# Patient Record
Sex: Male | Born: 1980 | Race: White | Hispanic: No | Marital: Married | Smoking: Never smoker
Health system: Southern US, Community
[De-identification: ages and names within clinical notes are randomized; demographics above are authoritative.]

## PROBLEM LIST (undated history)

## (undated) DIAGNOSIS — I10 Essential (primary) hypertension: Secondary | ICD-10-CM

## (undated) DIAGNOSIS — K644 Residual hemorrhoidal skin tags: Secondary | ICD-10-CM

## (undated) DIAGNOSIS — E785 Hyperlipidemia, unspecified: Secondary | ICD-10-CM

## (undated) DIAGNOSIS — T7840XA Allergy, unspecified, initial encounter: Secondary | ICD-10-CM

## (undated) HISTORY — DX: Residual hemorrhoidal skin tags: K64.4

## (undated) HISTORY — DX: Essential (primary) hypertension: I10

## (undated) HISTORY — DX: Hyperlipidemia, unspecified: E78.5

## (undated) HISTORY — DX: Allergy, unspecified, initial encounter: T78.40XA

---

## 2005-10-01 ENCOUNTER — Ambulatory Visit: Payer: Self-pay | Admitting: Family Medicine

## 2005-10-03 ENCOUNTER — Ambulatory Visit: Payer: Self-pay | Admitting: Family Medicine

## 2005-11-03 ENCOUNTER — Ambulatory Visit: Payer: Self-pay | Admitting: Family Medicine

## 2006-01-01 ENCOUNTER — Ambulatory Visit: Payer: Self-pay | Admitting: Family Medicine

## 2006-03-03 ENCOUNTER — Ambulatory Visit: Payer: Self-pay | Admitting: Family Medicine

## 2006-11-11 ENCOUNTER — Encounter: Payer: Self-pay | Admitting: Family Medicine

## 2006-11-11 DIAGNOSIS — I10 Essential (primary) hypertension: Secondary | ICD-10-CM

## 2006-11-11 DIAGNOSIS — R011 Cardiac murmur, unspecified: Secondary | ICD-10-CM | POA: Insufficient documentation

## 2006-11-11 DIAGNOSIS — E785 Hyperlipidemia, unspecified: Secondary | ICD-10-CM | POA: Insufficient documentation

## 2006-11-20 ENCOUNTER — Ambulatory Visit: Payer: Self-pay | Admitting: Family Medicine

## 2006-11-20 LAB — CONVERTED CEMR LAB
Albumin: 4.3 g/dL (ref 3.5–5.2)
CO2: 33 meq/L — ABNORMAL HIGH (ref 19–32)
Calcium: 10 mg/dL (ref 8.4–10.5)
Creatinine, Ser: 1.2 mg/dL (ref 0.4–1.5)
Glucose, Bld: 91 mg/dL (ref 70–99)
Phosphorus: 3 mg/dL (ref 2.3–4.6)
Potassium: 4.6 meq/L (ref 3.5–5.1)
Sodium: 141 meq/L (ref 135–145)
VLDL: 22 mg/dL (ref 0–40)

## 2009-02-16 ENCOUNTER — Ambulatory Visit: Payer: Self-pay | Admitting: Family Medicine

## 2009-02-19 LAB — CONVERTED CEMR LAB
ALT: 26 units/L (ref 0–53)
Alkaline Phosphatase: 86 units/L (ref 39–117)
BUN: 11 mg/dL (ref 6–23)
Basophils Relative: 0.1 % (ref 0.0–3.0)
Bilirubin, Direct: 0.1 mg/dL (ref 0.0–0.3)
Cholesterol: 270 mg/dL — ABNORMAL HIGH (ref 0–200)
Creatinine, Ser: 1.2 mg/dL (ref 0.4–1.5)
Eosinophils Absolute: 0.9 10*3/uL — ABNORMAL HIGH (ref 0.0–0.7)
Eosinophils Relative: 12.6 % — ABNORMAL HIGH (ref 0.0–5.0)
Glucose, Bld: 88 mg/dL (ref 70–99)
HDL: 55.4 mg/dL (ref >39.00)
Lymphocytes Relative: 25.2 % (ref 12.0–46.0)
MCV: 86.8 fL (ref 78.0–100.0)
Monocytes Absolute: 0.6 10*3/uL (ref 0.1–1.0)
Neutrophils Relative %: 53.8 % (ref 43.0–77.0)
Platelets: 268 10*3/uL (ref 150.0–400.0)
RBC: 5.97 M/uL — ABNORMAL HIGH (ref 4.22–5.81)
Sodium: 139 meq/L (ref 135–145)
Total Bilirubin: 1 mg/dL (ref 0.3–1.2)
Total CHOL/HDL Ratio: 5
Total Protein: 7.4 g/dL (ref 6.0–8.3)
Triglycerides: 157 mg/dL — ABNORMAL HIGH (ref 0.0–149.0)
VLDL: 31.4 mg/dL (ref 0.0–40.0)
WBC: 6.8 10*3/uL (ref 4.5–10.5)

## 2009-04-19 ENCOUNTER — Ambulatory Visit: Payer: Self-pay | Admitting: Family Medicine

## 2009-04-23 ENCOUNTER — Encounter: Payer: Self-pay | Admitting: Family Medicine

## 2009-04-23 LAB — CONVERTED CEMR LAB
ALT: 27 units/L (ref 0–53)
Cholesterol: 256 mg/dL — ABNORMAL HIGH (ref 0–200)
Total CHOL/HDL Ratio: 5
Triglycerides: 240 mg/dL — ABNORMAL HIGH (ref 0.0–149.0)
VLDL: 48 mg/dL — ABNORMAL HIGH (ref 0.0–40.0)

## 2009-06-07 ENCOUNTER — Ambulatory Visit: Payer: Self-pay | Admitting: Family Medicine

## 2009-06-07 LAB — CONVERTED CEMR LAB
ALT: 27 units/L (ref 0–53)
AST: 23 units/L (ref 0–37)
Cholesterol: 190 mg/dL (ref 0–200)
LDL Cholesterol: 103 mg/dL — ABNORMAL HIGH (ref 0–99)
Total CHOL/HDL Ratio: 4
VLDL: 34.4 mg/dL (ref 0.0–40.0)

## 2009-09-11 ENCOUNTER — Ambulatory Visit: Payer: Self-pay | Admitting: Family Medicine

## 2009-09-11 DIAGNOSIS — R109 Unspecified abdominal pain: Secondary | ICD-10-CM

## 2010-04-02 ENCOUNTER — Ambulatory Visit: Payer: Self-pay | Admitting: Family Medicine

## 2010-04-02 LAB — CONVERTED CEMR LAB
ALT: 19 units/L (ref 0–53)
AST: 23 units/L (ref 0–37)
BUN: 9 mg/dL (ref 6–23)
Basophils Absolute: 0.1 10*3/uL (ref 0.0–0.1)
CO2: 29 meq/L (ref 19–32)
Chloride: 100 meq/L (ref 96–112)
Cholesterol: 187 mg/dL (ref 0–200)
Eosinophils Relative: 8 % — ABNORMAL HIGH (ref 0.0–5.0)
GFR calc non Af Amer: 78.89 mL/min (ref >60)
HCT: 45.8 % (ref 39.0–52.0)
Hemoglobin: 15.7 g/dL (ref 13.0–17.0)
Lymphocytes Relative: 22.2 % (ref 12.0–46.0)
Monocytes Relative: 14.4 % — ABNORMAL HIGH (ref 3.0–12.0)
Neutro Abs: 4.3 10*3/uL (ref 1.4–7.7)
Phosphorus: 3.5 mg/dL (ref 2.3–4.6)
Potassium: 4.5 meq/L (ref 3.5–5.1)
RBC: 5.26 M/uL (ref 4.22–5.81)
RDW: 13 % (ref 11.5–14.6)
Sodium: 138 meq/L (ref 135–145)
WBC: 7.9 10*3/uL (ref 4.5–10.5)

## 2010-04-05 ENCOUNTER — Ambulatory Visit: Payer: Self-pay | Admitting: Family Medicine

## 2010-04-05 DIAGNOSIS — J309 Allergic rhinitis, unspecified: Secondary | ICD-10-CM | POA: Insufficient documentation

## 2010-08-27 NOTE — Assessment & Plan Note (Signed)
Summary: FOLLOW UP   Vital Signs:  Patient profile:   30 year old male Height:      67 inches Weight:      163.75 pounds BMI:     25.74 Temp:     97.9 degrees F oral Pulse rate:   76 / minute Pulse rhythm:   regular BP sitting:   138 / 88  (left arm) Cuff size:   regular  Vitals Entered By: Lewanda Rife LPN (September 11, 2009 8:00 AM)  Serial Vital Signs/Assessments:  Time      Position  BP       Pulse  Resp  Temp     By                     130/78                         Judith Part MD   History of Present Illness: here for f/u of HTN and hyperlipidemia   feels ok   ? if he has a hernia or pulled muscle -- on R side in groin area  started 1 week ago  ? if he strained it , does a lot of heavy lifting  not sharp pain- just discomfort- hurts to ride or sit  no bulge  ? if any worse with straining     bp toda stable 138/88 on first check  wt is down 4 lb  cholesterol is much imp on zocor with trig 172/ HDL 53 and LDL 103 is still really trying to watch his diet - difficult when traveling no side effects   Allergies (verified): No Known Drug Allergies  Past History:  Past Medical History: Last updated: 11/11/2006 Hyperlipidemia Hypertension  Past Surgical History: Last updated: 11/11/2006 2D Echo- neg. (09/2005)  Family History: Last updated: 02/16/2009 father HTN GF prostate cancer - died at 83  Social History: Last updated: 04/19/2009 non smoker  alcohol - 1-2 drinks per day max ( a little more in summer)   Review of Systems General:  Denies chills, fatigue, fever, loss of appetite, and malaise. Eyes:  Denies blurring. CV:  Denies chest pain or discomfort, palpitations, shortness of breath with exertion, and swelling of feet. Resp:  Denies cough and shortness of breath. GI:  Denies abdominal pain, bloody stools, change in bowel habits, gas, indigestion, and nausea. GU:  Denies discharge, dysuria, genital sores, hematuria, urinary frequency,  and urinary hesitancy. MS:  Denies joint redness, joint swelling, and muscle weakness. Derm:  Denies itching, lesion(s), poor wound healing, and rash. Neuro:  Denies headaches, numbness, and tingling. Endo:  Denies excessive thirst and excessive urination. Heme:  Denies abnormal bruising and bleeding.  Physical Exam  General:  Well-developed,well-nourished,in no acute distress; alert,appropriate and cooperative throughout examination Head:  normocephalic, atraumatic, and no abnormalities observed.   Eyes:  vision grossly intact, pupils equal, pupils round, and pupils reactive to light.  no conjunctival pallor, injection or icterus  Neck:  supple with full rom and no masses or thyromegally, no JVD or carotid bruit  Lungs:  Normal respiratory effort, chest expands symmetrically. Lungs are clear to auscultation, no crackles or wheezes. Heart:  RRR, quiet systolic murmur Abdomen:  Bowel sounds positive,abdomen soft and non-tender without masses, organomegaly or hernias noted. no renal bruits  no suprapubic tenderness or fullness felt   Genitalia:  very slt tenderness low R inguinal area  no M or LN or skin changes  no testicular masses or tenderness no hernias noted   Msk:  no LS tenderness no cva tenderness  nl rom legs and hips without pain  Pulses:  R and L carotid,radial,femoral,dorsalis pedis and posterior tibial pulses are full and equal bilaterally Extremities:  No clubbing, cyanosis, edema, or deformity noted with normal full range of motion of all joints.   Neurologic:  sensation intact to light touch, gait normal, and DTRs symmetrical and normal.   Skin:  Intact without suspicious lesions or rashes Cervical Nodes:  No lymphadenopathy noted Inguinal Nodes:  No significant adenopathy Psych:  normal affect, talkative and pleasant    Impression & Recommendations:  Problem # 1:  HYPERTENSION (ICD-401.9) Assessment Improved  is well controlled - better on second  check continue working on lifestyle  no change in med lab and f/u in fall planned  His updated medication list for this problem includes:    Prinivil 5 Mg Tabs (Lisinopril) .Marland Kitchen... 1 by mouth once daily  BP today: 138/88- re check 130/78 Prior BP: 120/86 (04/19/2009)  Labs Reviewed: K+: 4.2 (02/16/2009) Creat: : 1.2 (02/16/2009)   Chol: 190 (06/07/2009)   HDL: 53.00 (06/07/2009)   LDL: 103 (06/07/2009)   TG: 172.0 (06/07/2009)  Problem # 2:  HYPERLIPIDEMIA (ICD-272.4) Assessment: Improved  much imp with statin and diet rev sat fat diet  lab and f/u planned for fall  px sent  His updated medication list for this problem includes:    Zocor 20 Mg Tabs (Simvastatin) .Marland Kitchen... 1 by mouth once daily in evening with a low fat snack  Labs Reviewed: SGOT: 23 (06/07/2009)   SGPT: 27 (06/07/2009)   HDL:53.00 (06/07/2009), 47.50 (04/19/2009)  LDL:103 (06/07/2009), DEL (11/20/2006)  Chol:190 (06/07/2009), 256 (04/19/2009)  Trig:172.0 (06/07/2009), 240.0 (04/19/2009)  Orders: Prescription Created Electronically [phone])  Problem # 3:  INGUINAL PAIN, RIGHT (ICD-789.09) Assessment: New recurrent - after a strain  nl exam and no hernias detected  suspect groin strain  recommend heat / stretching gently and avoid heavy lifting if not imp in 2 wk- will call for urology appt  also adv to call if worse pain or any bulging  Complete Medication List: 1)  Prinivil 5 Mg Tabs (Lisinopril) .Marland Kitchen.. 1 by mouth once daily 2)  Multivitamins Tabs (Multiple vitamin) .... Take 1 tablet by mouth once a day 3)  Zocor 20 Mg Tabs (Simvastatin) .Marland Kitchen.. 1 by mouth once daily in evening with a low fat snack  Patient Instructions: 1)  use warm compress on area of discomfort  2)  lift using your legs  3)  if not improved in 2 weeks - please call me so I can refer you to a urologist  4)  no change in medicines  5)  schedule fasting labs in september lipid/ast/alt/renal /cbc with diff / tsh 401.1  Prescriptions: ZOCOR 20  MG TABS (SIMVASTATIN) 1 by mouth once daily in evening with a low fat snack  #30 x 11   Entered and Authorized by:   Judith Part MD   Signed by:   Judith Part MD on 09/11/2009   Method used:   Electronically to        [facility] [address] (retail)       [address], [address]       [place], [place]  [address]       Ph: [phone]       Fax: [phone]   RxID:  [id number]   Current Allergies (reviewed today): No known allergies

## 2010-08-27 NOTE — Assessment & Plan Note (Signed)
Summary: SEPT FOLLOW UP/[facility]   Vital Signs:  Patient profile:   30 year old male Height:      67 inches Weight:      151.31 pounds BMI:     23.78 Temp:     98.1 degrees F oral Pulse rate:   80 / minute Pulse rhythm:   regular BP sitting:   144 / 92  (left arm) Cuff size:   regular  Vitals Entered By: Lewanda Rife LPN (April 05, 2010 8:10 AM) CC: Sept f/u   History of Present Illness: here for f/u of HTN and lipids   wt is down 12 lb has been working on it  also a long hot summer -- eating less  is very active   is feeling ok overall   thinks he has allergies -- dripping and draining -- post nasal drip  allegra did not work    bp 144/92 first check  took his bp med   chol imp on zocor and diet with trig 149 and HDL 64 and LDL 93 (down from 103)  has been stressed   some ext hemorroids - occ they hurt     Allergies (verified): No Known Drug Allergies  Past History:  Past Surgical History: Last updated: 11/11/2006 2D Echo- neg. (09/2005)  Family History: Last updated: 02/16/2009 father HTN GF prostate cancer - died at 59  Social History: Last updated: 04/19/2009 non smoker  alcohol - 1-2 drinks per day max ( a little more in summer)   Past Medical History: Hyperlipidemia Hypertension allergic rhinitis ext hemorroids   Review of Systems General:  Denies chills, fatigue, fever, loss of appetite, and malaise. Eyes:  Denies blurring and eye irritation. ENT:  Complains of nasal congestion and postnasal drainage; denies sinus pressure and sore throat. CV:  Denies chest pain or discomfort, palpitations, and shortness of breath with exertion. Resp:  Denies cough, pleuritic, shortness of breath, sputum productive, and wheezing. GI:  Denies abdominal pain and change in bowel habits. GU:  Denies urinary frequency. MS:  Denies muscle aches and cramps. Derm:  Denies itching, lesion(s), poor wound healing, and rash. Neuro:  Denies headaches, numbness,  and tingling. Psych:  Denies anxiety and depression. Endo:  Denies cold intolerance, excessive thirst, excessive urination, and heat intolerance. Heme:  Denies abnormal bruising and bleeding. Allergy:  Complains of seasonal allergies and sneezing.  Physical Exam  General:  Well-developed,well-nourished,in no acute distress; alert,appropriate and cooperative throughout examination Head:  normocephalic, atraumatic, and no abnormalities observed.  no sinus tenderness Eyes:  vision grossly intact, pupils equal, pupils round, pupils reactive to light, and no injection.   Ears:  R ear normal and L ear normal.   Nose:  nares are injected and congested bilaterally  Mouth:  pharynx pink and moist.   Neck:  supple with full rom and no masses or thyromegally, no JVD or carotid bruit  Lungs:  Normal respiratory effort, chest expands symmetrically. Lungs are clear to auscultation, no crackles or wheezes. Heart:  RRR, quiet systolic murmur Abdomen:  Bowel sounds positive,abdomen soft and non-tender without masses, organomegaly or hernias noted. no renal bruits  Msk:  No deformity or scoliosis noted of thoracic or lumbar spine.   Pulses:  R and L carotid,radial,femoral,dorsalis pedis and posterior tibial pulses are full and equal bilaterally Extremities:  No clubbing, cyanosis, edema, or deformity noted with normal full range of motion of all joints.   Neurologic:  sensation intact to light touch, gait normal, and DTRs  symmetrical and normal.   Skin:  Intact without suspicious lesions or rashes Cervical Nodes:  No lymphadenopathy noted Psych:  normal affect, talkative and pleasant    Impression & Recommendations:  Problem # 1:  HYPERTENSION (ICD-401.9) Assessment Deteriorated  this is slt worse - pt reports systolics over 140 will inc prinivil to 10  update if no imp f/u 6 mo as planned rev labs today His updated medication list for this problem includes:    Prinivil 10 Mg Tabs (Lisinopril)  .Marland Kitchen... 1 by mouth once daily  BP today: 144/92 Prior BP: 138/88 (09/11/2009)  Labs Reviewed: K+: 4.5 (04/02/2010) Creat: : 1.2 (04/02/2010)   Chol: 187 (04/02/2010)   HDL: 64.60 (04/02/2010)   LDL: 93 (04/02/2010)   TG: 149.0 (04/02/2010)  Problem # 2:  HYPERLIPIDEMIA (ICD-272.4) Assessment: Improved  this is imp with better diet rev low sat fat diet  continue zocor  f/u 6 mo  His updated medication list for this problem includes:    Zocor 20 Mg Tabs (Simvastatin) .Marland Kitchen... 1 by mouth once daily in evening with a low fat snack  Labs Reviewed: SGOT: 23 (04/02/2010)   SGPT: 19 (04/02/2010)   HDL:64.60 (04/02/2010), 53.00 (06/07/2009)  LDL:93 (04/02/2010), 103 (16/04/9603)  Chol:187 (04/02/2010), 190 (06/07/2009)  Trig:149.0 (04/02/2010), 172.0 (06/07/2009)  Problem # 3:  ALLERGIC RHINITIS (ICD-477.9) Assessment: New mild with runny nose and no imp with allegra recommended trial of zyrtec otc and update if headache or fever will call His updated medication list for this problem includes:    Allegra Allergy 60 Mg Tabs (Fexofenadine hcl) ..... Otc as directed.  Complete Medication List: 1)  Prinivil 10 Mg Tabs (Lisinopril) .Marland Kitchen.. 1 by mouth once daily 2)  Multivitamins Tabs (Multiple vitamin) .... Take 1 tablet by mouth once a day 3)  Zocor 20 Mg Tabs (Simvastatin) .Marland Kitchen.. 1 by mouth once daily in evening with a low fat snack 4)  Allegra Allergy 60 Mg Tabs (Fexofenadine hcl) .... Otc as directed.  Patient Instructions: 1)  try zyrtec 10 mg at night for nasal symptoms 2)  increase your prinivil to 10 mg daily  3)  no change in other medicines 4)  keep watching diet for salt and fat  5)  follow up in 6 months  Prescriptions: PRINIVIL 10 MG TABS (LISINOPRIL) 1 by mouth once daily  #30 x 11   Entered and Authorized by:   Judith Part MD   Signed by:   Judith Part MD on 04/05/2010   Method used:   Print then Give to Patient   RxID:   [id number]   Current Allergies  (reviewed today): No known allergies

## 2010-10-01 ENCOUNTER — Ambulatory Visit: Payer: Self-pay | Admitting: Family Medicine

## 2010-11-10 ENCOUNTER — Other Ambulatory Visit: Payer: Self-pay | Admitting: Family Medicine

## 2010-11-30 ENCOUNTER — Encounter: Payer: Self-pay | Admitting: Family Medicine

## 2010-12-02 ENCOUNTER — Other Ambulatory Visit (INDEPENDENT_AMBULATORY_CARE_PROVIDER_SITE_OTHER): Payer: BC Managed Care – PPO | Admitting: Family Medicine

## 2010-12-02 DIAGNOSIS — E7800 Pure hypercholesterolemia, unspecified: Secondary | ICD-10-CM

## 2010-12-02 DIAGNOSIS — I10 Essential (primary) hypertension: Secondary | ICD-10-CM

## 2010-12-02 LAB — COMPREHENSIVE METABOLIC PANEL
ALT: 16 U/L (ref 0–53)
AST: 20 U/L (ref 0–37)
Alkaline Phosphatase: 66 U/L (ref 39–117)
CO2: 28 mEq/L (ref 19–32)
Creatinine, Ser: 1.2 mg/dL (ref 0.4–1.5)
GFR: 72.01 mL/min (ref >60.00)
Sodium: 138 mEq/L (ref 135–145)
Total Bilirubin: 0.7 mg/dL (ref 0.3–1.2)
Total Protein: 7.4 g/dL (ref 6.0–8.3)

## 2010-12-02 LAB — CBC WITH DIFFERENTIAL/PLATELET
Basophils Absolute: 0 10*3/uL (ref 0.0–0.1)
Basophils Relative: 0.4 % (ref 0.0–3.0)
Eosinophils Absolute: 0.4 10*3/uL (ref 0.0–0.7)
HCT: 43.6 % (ref 39.0–52.0)
Hemoglobin: 14.8 g/dL (ref 13.0–17.0)
Lymphocytes Relative: 30.3 % (ref 12.0–46.0)
Lymphs Abs: 2.5 10*3/uL (ref 0.7–4.0)
MCHC: 33.9 g/dL (ref 30.0–36.0)
MCV: 86.7 fl (ref 78.0–100.0)
Neutro Abs: 4.5 10*3/uL (ref 1.4–7.7)
RBC: 5.03 Mil/uL (ref 4.22–5.81)
RDW: 13.6 % (ref 11.5–14.6)

## 2010-12-02 LAB — LIPID PANEL
HDL: 56.8 mg/dL (ref >39.00)
LDL Cholesterol: 107 mg/dL — ABNORMAL HIGH (ref 0–99)
Total CHOL/HDL Ratio: 3
Triglycerides: 93 mg/dL (ref 0.0–149.0)
VLDL: 18.6 mg/dL (ref 0.0–40.0)

## 2010-12-09 ENCOUNTER — Encounter: Payer: Self-pay | Admitting: Family Medicine

## 2010-12-09 ENCOUNTER — Ambulatory Visit (INDEPENDENT_AMBULATORY_CARE_PROVIDER_SITE_OTHER): Payer: BC Managed Care – PPO | Admitting: Family Medicine

## 2010-12-09 DIAGNOSIS — E785 Hyperlipidemia, unspecified: Secondary | ICD-10-CM

## 2010-12-09 DIAGNOSIS — I10 Essential (primary) hypertension: Secondary | ICD-10-CM

## 2010-12-09 DIAGNOSIS — Z23 Encounter for immunization: Secondary | ICD-10-CM

## 2010-12-09 DIAGNOSIS — Z0000 Encounter for general adult medical examination without abnormal findings: Secondary | ICD-10-CM

## 2010-12-09 MED ORDER — LISINOPRIL 10 MG PO TABS: 10.0000 mg | ORAL_TABLET | Freq: Every day | ORAL | Status: DC

## 2010-12-09 MED ORDER — SIMVASTATIN 20 MG PO TABS: 20.0000 mg | ORAL_TABLET | Freq: Every day | ORAL | Status: DC

## 2010-12-09 NOTE — Assessment & Plan Note (Signed)
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

## 2010-12-09 NOTE — Assessment & Plan Note (Signed)
Much better on 2nd check today See inst re: meter Disc lower sodium diet Keep up exercise Ace refilled

## 2010-12-09 NOTE — Assessment & Plan Note (Signed)
Fair control- up a bit after missing some zocor doses Disc low sat fat diet Rev labs in detail Refilled statin- no prob with it

## 2010-12-09 NOTE — Progress Notes (Signed)
Subjective:    Patient ID: Robert Dickson, male    DOB: [birth year], 30 y.o.   MRN: [id number]  HPI Here for wellness exam   Feeling good - no new medical issues   Lipids are stable with LDL of 107 this check He did go a while off med -- was out of it  Now back on it 2 weeks  Lab Results  Component Value Date   CHOL 182 12/02/2010   CHOL 187 04/02/2010   CHOL 190 06/07/2009   Lab Results  Component Value Date   HDL 56.80 12/02/2010   HDL 64.60 04/02/2010   HDL 53.00 06/07/2009   Lab Results  Component Value Date   LDLCALC 107* 12/02/2010   LDLCALC 93 04/02/2010   LDLCALC 103* 06/07/2009   Lab Results  Component Value Date   TRIG 93.0 12/02/2010   TRIG 149.0 04/02/2010   TRIG 172.0* 06/07/2009   Lab Results  Component Value Date   CHOLHDL 3 12/02/2010   CHOLHDL 3 04/02/2010   CHOLHDL 4 06/07/2009   Lab Results  Component Value Date   LDLDIRECT 180.3 04/19/2009   LDLDIRECT 195.9 02/16/2009   LDLDIRECT 154.9 11/20/2006     HTN -- bp is 142/92- this is stable  Borderline  No ha or cp or swelling  On ace- no trouble with med at all  Checks at home - does not think it is accurate --140s/ 70-90   Wt is up 7 lb  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 Hypertension Father     gfather died of prostate cancer (paternal)   No urinary symptoms- /nocturia or stream changes  Past Medical History  Diagnosis Date  . Hyperlipidemia   . Hypertension   . Allergy     allergic rhinitis  . External hemorrhoid     History   Social History  . Marital Status: Single    Spouse Name: N/A    Number of Children: N/A  . Years of Education: N/A   Occupational History  . Not on file.   Social History Main Topics  . Smoking status: Never Smoker   . Smokeless tobacco: Not on file  . Alcohol Use: Yes     1-2  drinks/day(a littlem ore in summer)  . Drug Use:   . Sexually Active:    Other Topics Concern  . Not on file   Social History Narrative  . No narrative on file   No past surgical history on file. No Known Allergies   Review of Systems Review of Systems  Constitutional: Negative for fever, appetite change, fatigue and unexpected weight change.  Eyes: Negative for pain and visual disturbance.  Respiratory: Negative for cough and shortness of breath.   Cardiovascular: Negative for cp or sob or edema .   Gastrointestinal: Negative for nausea, diarrhea and constipation.  Genitourinary: Negative for urgency and frequency.  Skin: Negative for pallor.  Neurological: Negative for weakness, light-headedness, numbness and headaches.  Hematological: Negative for adenopathy. Does not bruise/bleed easily.  Psychiatric/Behavioral: Negative for dysphoric mood. The patient is not nervous/anxious.           Objective:   Physical Exam  Constitutional: He appears well-developed and well-nourished. No distress.  HENT:  Head: Normocephalic and atraumatic.  Right Ear: External ear normal.  Left Ear: External ear normal.  Nose: Nose normal.  Mouth/Throat: Oropharynx is clear and moist.  Eyes: Conjunctivae and EOM are normal. Pupils are equal, round, and reactive to light.  Neck: Normal range of motion. Neck supple. No JVD present. Carotid bruit is not present. No thyromegaly present.  Cardiovascular: Normal rate, regular rhythm and normal heart sounds.        No M heard today  Pulmonary/Chest: Effort normal and breath sounds normal. No respiratory distress. He has no wheezes. He exhibits no tenderness.  Abdominal: Soft. Bowel sounds are normal. He exhibits no distension, no abdominal bruit and no mass. There is no tenderness.  Musculoskeletal: Normal range of motion. He exhibits no edema and no tenderness.  Lymphadenopathy:    He has no cervical adenopathy.  Neurological: He is alert. He has  normal reflexes. Coordination normal.  Skin: Skin is warm and dry. No rash noted. No erythema. No pallor.  Psychiatric: He has a normal mood and affect.          Assessment & Plan:

## 2010-12-09 NOTE — Patient Instructions (Signed)
No change in medicine bp was better 2nd check 128/80  If you want a new cuff for home I recommend OMRON cuff for arm (not wrist)  Try to choose better foods when eating out tetnus shot today

## 2012-01-16 ENCOUNTER — Other Ambulatory Visit: Payer: Self-pay | Admitting: Family Medicine

## 2012-01-16 NOTE — Telephone Encounter (Signed)
30-day supply given/SLS *PATIENT IS DUE FOR OFFICE VISIT*

## 2012-02-09 ENCOUNTER — Other Ambulatory Visit: Payer: Self-pay

## 2012-02-09 MED ORDER — LISINOPRIL 10 MG PO TABS: 10.0000 mg | ORAL_TABLET | Freq: Every day | ORAL | Status: DC

## 2012-02-09 MED ORDER — SIMVASTATIN 20 MG PO TABS: 20.0000 mg | ORAL_TABLET | Freq: Every day | ORAL | Status: DC

## 2012-02-09 NOTE — Telephone Encounter (Signed)
Will refill electronically  

## 2012-02-09 NOTE — Telephone Encounter (Signed)
Pt request refill Simvastatin and Lisinopril to [facility] [address]. Pt last seen 12/09/10; CPX scheduled 06/25/12.Please advise.

## 2012-06-20 ENCOUNTER — Telehealth: Payer: Self-pay | Admitting: Family Medicine

## 2012-06-20 DIAGNOSIS — E785 Hyperlipidemia, unspecified: Secondary | ICD-10-CM

## 2012-06-20 DIAGNOSIS — Z0000 Encounter for general adult medical examination without abnormal findings: Secondary | ICD-10-CM

## 2012-06-20 NOTE — Telephone Encounter (Signed)
Message copied by Judy Pimple on Sun Jun 20, 2012  8:13 PM ------      Message from: Baldomero Lamy      Created: Thu Jun 10, 2012 11:41 AM      Regarding: Cpx labs 06/21/12 Mon       Please order  future cpx labs for pt's upcomming lab appt.      Thanks      Rodney Booze

## 2012-06-21 ENCOUNTER — Other Ambulatory Visit (INDEPENDENT_AMBULATORY_CARE_PROVIDER_SITE_OTHER): Payer: BC Managed Care – PPO

## 2012-06-21 DIAGNOSIS — Z0000 Encounter for general adult medical examination without abnormal findings: Secondary | ICD-10-CM

## 2012-06-21 DIAGNOSIS — E785 Hyperlipidemia, unspecified: Secondary | ICD-10-CM

## 2012-06-21 LAB — CBC WITH DIFFERENTIAL/PLATELET
Basophils Absolute: 0 10*3/uL (ref 0.0–0.1)
Eosinophils Absolute: 0.6 10*3/uL (ref 0.0–0.7)
Lymphocytes Relative: 38.9 % (ref 12.0–46.0)
MCHC: 33.3 g/dL (ref 30.0–36.0)
Neutro Abs: 3.7 10*3/uL (ref 1.4–7.7)
Neutrophils Relative %: 45.9 % (ref 43.0–77.0)
RDW: 13.1 % (ref 11.5–14.6)

## 2012-06-21 LAB — COMPREHENSIVE METABOLIC PANEL
ALT: 18 U/L (ref 0–53)
AST: 22 U/L (ref 0–37)
Alkaline Phosphatase: 64 U/L (ref 39–117)
Calcium: 9.8 mg/dL (ref 8.4–10.5)
Chloride: 103 mEq/L (ref 96–112)
Creatinine, Ser: 1.1 mg/dL (ref 0.4–1.5)
Potassium: 4.9 mEq/L (ref 3.5–5.1)

## 2012-06-21 LAB — LIPID PANEL
HDL: 65 mg/dL (ref >39.00)
Total CHOL/HDL Ratio: 3

## 2012-06-21 LAB — LDL CHOLESTEROL, DIRECT: Direct LDL: 139.5 mg/dL

## 2012-06-21 LAB — TSH: TSH: 1.98 u[IU]/mL (ref 0.35–5.50)

## 2012-06-25 ENCOUNTER — Encounter: Payer: BC Managed Care – PPO | Admitting: Family Medicine

## 2012-06-28 ENCOUNTER — Encounter: Payer: Self-pay | Admitting: Family Medicine

## 2012-06-28 ENCOUNTER — Ambulatory Visit (INDEPENDENT_AMBULATORY_CARE_PROVIDER_SITE_OTHER): Payer: BC Managed Care – PPO | Admitting: Family Medicine

## 2012-06-28 VITALS — BP 135/85 | HR 78 | Temp 98.6°F | Ht 67.25 in | Wt 161.2 lb

## 2012-06-28 DIAGNOSIS — Z0000 Encounter for general adult medical examination without abnormal findings: Secondary | ICD-10-CM

## 2012-06-28 DIAGNOSIS — I10 Essential (primary) hypertension: Secondary | ICD-10-CM

## 2012-06-28 DIAGNOSIS — E785 Hyperlipidemia, unspecified: Secondary | ICD-10-CM

## 2012-06-28 DIAGNOSIS — Z23 Encounter for immunization: Secondary | ICD-10-CM

## 2012-06-28 MED ORDER — SIMVASTATIN 20 MG PO TABS: 20.0000 mg | ORAL_TABLET | Freq: Every day | ORAL | Status: DC

## 2012-06-28 MED ORDER — LISINOPRIL 10 MG PO TABS: 10.0000 mg | ORAL_TABLET | Freq: Every day | ORAL | Status: DC

## 2012-06-28 NOTE — Assessment & Plan Note (Signed)
Reviewed health habits including diet and exercise and skin cancer prevention Also reviewed health mt list, fam hx and immunizations  Flu vaccine today Urged to quit smoking entirely  Wellness labs reviewed

## 2012-06-28 NOTE — Patient Instructions (Addendum)
Blood pressure was better on 2nd check  Keep an eye on it at home- alert me if over 140/90  Check it when relaxed  Try to minimize or quit smoking Flu shot today  No change in medicine

## 2012-06-28 NOTE — Assessment & Plan Note (Signed)
Better on 2nd check  May be inc slt with day quil otc Will watch this at home Labs rev Health habits rev Urged to quit smoking entirely

## 2012-06-28 NOTE — Assessment & Plan Note (Signed)
Lipids up a bit - but draw was done right after TG dinner On zocor-no problems Disc goals for lipids and reasons to control them Rev labs with pt Rev low sat fat diet in detail

## 2012-06-28 NOTE — Progress Notes (Signed)
Subjective:    Patient ID: Robert Dickson, male    DOB: [birth year], 31 y.o.   MRN: [id number]  HPI Here for health maintenance exam and to review chronic medical problems   Is doing ok overall   Nothing new going on health wise  Has passed a cold around the house   Took day quil-- and that could run bp up   Wants to get a flu shot   Wt is up 3 lb with bmi of 25  bp is up today on first check  No cp or palpitations or headaches or edema  No side effects to medicines  Thinks he took his medicine this am  BP Readings from Last 3 Encounters:  06/28/12 158/104  12/09/10 128/80  04/05/10 144/92     Smoking status Smokes once in a blue moon - socially   Hyperlipidemia Lab Results  Component Value Date   CHOL 211* 06/21/2012   CHOL 182 12/02/2010   CHOL 187 04/02/2010   Lab Results  Component Value Date   HDL 65.00 06/21/2012   HDL 56.80 12/02/2010   HDL 64.60 04/02/2010   Lab Results  Component Value Date   LDLCALC 107* 12/02/2010   LDLCALC 93 04/02/2010   LDLCALC 103* 06/07/2009   Lab Results  Component Value Date   TRIG 175.0* 06/21/2012   TRIG 93.0 12/02/2010   TRIG 149.0 04/02/2010   Lab Results  Component Value Date   CHOLHDL 3 06/21/2012   CHOLHDL 3 12/02/2010   CHOLHDL 3 04/02/2010   Lab Results  Component Value Date   LDLDIRECT 139.5 06/21/2012   LDLDIRECT 180.3 04/19/2009   LDLDIRECT 195.9 02/16/2009  on zocor-no problems with that     Mood - has a lot of stress  Handling it well - mood is good    Patient Active Problem List  Diagnosis  . HYPERLIPIDEMIA  . HYPERTENSION  . ALLERGIC RHINITIS  . MURMUR  . Routine general medical examination at a health care facility   Past Medical History  Diagnosis Date  . Hyperlipidemia   . Hypertension   . Allergy     allergic rhinitis  . External hemorrhoid    No past surgical history on file. History  Substance Use Topics  . Smoking status: Current Some Day Smoker    Types: Cigarettes  . Smokeless tobacco: Not  on file     Comment: social smoker  . Alcohol Use: Yes     Comment: 1-2 drinks/day(a littlem ore in summer)   Family History  Problem Relation Age of Onset  . Hypertension Father    No Known Allergies Current Outpatient Prescriptions on File Prior to Visit  Medication Sig Dispense Refill  . fexofenadine (ALLEGRA) 60 MG tablet OTC as directed.       Marland Kitchen lisinopril (PRINIVIL,ZESTRIL) 10 MG tablet Take 1 tablet (10 mg total) by mouth daily.  30 tablet  5  . Multiple Vitamin (MULTIVITAMIN) capsule Take 1 capsule by mouth daily.        . simvastatin (ZOCOR) 20 MG tablet Take 1 tablet (20 mg total) by mouth at bedtime.  30 tablet  5  . [DISCONTINUED] simvastatin (ZOCOR) 20 MG tablet Take 1 tablet (20 mg total) by mouth at bedtime.  30 tablet  11     Review of Systems Review of Systems  Constitutional: Negative for fever, appetite change, fatigue and unexpected weight change.  Eyes: Negative for pain and visual disturbance.  ENT pos for runny nose and post  nasal drip from a cold  Respiratory: Negative for cough and shortness of breath.   Cardiovascular: Negative for cp or palpitations    Gastrointestinal: Negative for nausea, diarrhea and constipation.  Genitourinary: Negative for urgency and frequency.  Skin: Negative for pallor or rash   Neurological: Negative for weakness, light-headedness, numbness and headaches.  Hematological: Negative for adenopathy. Does not bruise/bleed easily.  Psychiatric/Behavioral: Negative for dysphoric mood. The patient is not nervous/anxious.         Objective:   Physical Exam  Constitutional: He appears well-developed and well-nourished. No distress.  HENT:  Head: Normocephalic and atraumatic.  Right Ear: External ear normal.  Left Ear: External ear normal.  Mouth/Throat: Oropharynx is clear and moist. No oropharyngeal exudate.       Nares are injected and congested  No sinus tenderness  Eyes: Conjunctivae normal and EOM are normal. Pupils are  equal, round, and reactive to light. Right eye exhibits no discharge. Left eye exhibits no discharge. No scleral icterus.  Neck: Normal range of motion. Neck supple. No JVD present. Carotid bruit is not present. No thyromegaly present.  Cardiovascular: Normal rate, regular rhythm and intact distal pulses.  Exam reveals no gallop.   Murmur heard. Pulmonary/Chest: Effort normal and breath sounds normal. No respiratory distress. He has no wheezes. He exhibits no tenderness.  Abdominal: Soft. Bowel sounds are normal. He exhibits no distension, no abdominal bruit and no mass. There is no tenderness.  Musculoskeletal: He exhibits no edema and no tenderness.  Lymphadenopathy:    He has no cervical adenopathy.  Neurological: He is alert. He has normal reflexes. No cranial nerve deficit. He exhibits normal muscle tone. Coordination normal.  Skin: Skin is warm and dry. No rash noted. No erythema. No pallor.       Solar lentigos noted   Psychiatric: He has a normal mood and affect.          Assessment & Plan:

## 2013-07-14 ENCOUNTER — Other Ambulatory Visit: Payer: Self-pay | Admitting: Family Medicine

## 2013-07-14 NOTE — Telephone Encounter (Signed)
Please schedule PE in late spring and refill until then, thanks

## 2013-07-14 NOTE — Telephone Encounter (Signed)
Electronic refill request, no recent/future appt., please advise  

## 2013-07-15 NOTE — Telephone Encounter (Signed)
LM on pt vm requesting a call back

## 2013-07-19 NOTE — Telephone Encounter (Signed)
Pt coming in tomorrow for med refill, will refill at appt, med declined and advise pharmacy we will refill at appt

## 2013-07-20 ENCOUNTER — Ambulatory Visit (INDEPENDENT_AMBULATORY_CARE_PROVIDER_SITE_OTHER): Payer: PRIVATE HEALTH INSURANCE | Admitting: Family Medicine

## 2013-07-20 ENCOUNTER — Encounter: Payer: Self-pay | Admitting: Family Medicine

## 2013-07-20 VITALS — BP 158/92 | HR 88 | Temp 98.2°F | Ht 67.25 in | Wt 157.5 lb

## 2013-07-20 DIAGNOSIS — B070 Plantar wart: Secondary | ICD-10-CM

## 2013-07-20 DIAGNOSIS — E785 Hyperlipidemia, unspecified: Secondary | ICD-10-CM

## 2013-07-20 DIAGNOSIS — I10 Essential (primary) hypertension: Secondary | ICD-10-CM

## 2013-07-20 DIAGNOSIS — Z23 Encounter for immunization: Secondary | ICD-10-CM

## 2013-07-20 DIAGNOSIS — J309 Allergic rhinitis, unspecified: Secondary | ICD-10-CM

## 2013-07-20 LAB — LIPID PANEL: HDL: 85.3 mg/dL (ref >39.00)

## 2013-07-20 LAB — CBC WITH DIFFERENTIAL/PLATELET
Basophils Absolute: 0 10*3/uL (ref 0.0–0.1)
Basophils Relative: 0.5 % (ref 0.0–3.0)
Eosinophils Absolute: 0.3 10*3/uL (ref 0.0–0.7)
HCT: 48.1 % (ref 39.0–52.0)
Hemoglobin: 16 g/dL (ref 13.0–17.0)
Lymphs Abs: 2.6 10*3/uL (ref 0.7–4.0)
MCHC: 33.2 g/dL (ref 30.0–36.0)
MCV: 85.6 fl (ref 78.0–100.0)
Monocytes Absolute: 0.6 10*3/uL (ref 0.1–1.0)
Neutro Abs: 3.8 10*3/uL (ref 1.4–7.7)
Platelets: 381 10*3/uL (ref 150.0–400.0)
RBC: 5.62 Mil/uL (ref 4.22–5.81)

## 2013-07-20 LAB — COMPREHENSIVE METABOLIC PANEL
ALT: 19 U/L (ref 0–53)
AST: 25 U/L (ref 0–37)
Creatinine, Ser: 1.1 mg/dL (ref 0.4–1.5)
Sodium: 137 mEq/L (ref 135–145)
Total Bilirubin: 0.4 mg/dL (ref 0.3–1.2)

## 2013-07-20 LAB — LDL CHOLESTEROL, DIRECT: Direct LDL: 102.5 mg/dL

## 2013-07-20 MED ORDER — FLUTICASONE PROPIONATE 50 MCG/ACT NA SUSP: 2.0000 | Freq: Every day | NASAL | Status: DC

## 2013-07-20 MED ORDER — SIMVASTATIN 20 MG PO TABS: 20.0000 mg | ORAL_TABLET | Freq: Every day | ORAL | Status: DC

## 2013-07-20 MED ORDER — LISINOPRIL 10 MG PO TABS: 10.0000 mg | ORAL_TABLET | Freq: Every day | ORAL | Status: DC

## 2013-07-20 NOTE — Progress Notes (Signed)
Pre-visit discussion using our clinic review tool. No additional management support is needed unless otherwise documented below in the visit note.  

## 2013-07-20 NOTE — Patient Instructions (Signed)
Take care of yourself  Re start medicine  Try compound W on wart and soak it / use pumice stone over time to wear it down (follow up if no immprovement) For allergies- start flonase daily and update me if worse or not improving in the next 2 weeks

## 2013-07-20 NOTE — Progress Notes (Signed)
Subjective:    Patient ID: Robert Dickson, male    DOB: 09-20-1980, 32 y.o.   MRN: [id number]  HPI Here for medicine refills and thinks he may have sinus infection / allergies  Out 1-2 weeks -- and bp is high   bp is up  today  No cp or palpitations or headaches or edema  No side effects to medicines  BP Readings from Last 3 Encounters:  07/20/13 158/92  06/28/12 135/85  12/09/10 128/80     No problematic blood pressure  He is taking care of himself fairly   Eats so/ so --- watches out for cholesterol    (family is on health kick) No exercise outside of work  Is fasting today  Cholesterol will be up a bit due to missing medicine   Just had flu shot now   Some chronic sinus congestion  Going on for months No fever No facial pain/ a little pressure in am   Smokes very rarely   Is thinking about a vasectomy    Patient Active Problem List   Diagnosis Date Noted  . Routine general medical examination at a health care facility 12/09/2010  . ALLERGIC RHINITIS 04/05/2010  . HYPERLIPIDEMIA 11/11/2006  . HYPERTENSION 11/11/2006  . MURMUR 11/11/2006   Past Medical History  Diagnosis Date  . Hyperlipidemia   . Hypertension   . Allergy     allergic rhinitis  . External hemorrhoid    No past surgical history on file. History  Substance Use Topics  . Smoking status: Current Some Day Smoker    Types: Cigarettes  . Smokeless tobacco: Not on file     Comment: social smoker  . Alcohol Use: Yes     Comment: 1-2 drinks/day(a littlem ore in summer)   Family History  Problem Relation Age of Onset  . Hypertension Father    No Known Allergies Current Outpatient Prescriptions on File Prior to Visit  Medication Sig Dispense Refill  . fexofenadine (ALLEGRA) 60 MG tablet OTC as directed.       Marland Kitchen lisinopril (PRINIVIL,ZESTRIL) 10 MG tablet Take 1 tablet (10 mg total) by mouth daily.  30 tablet  11  . Multiple Vitamin (MULTIVITAMIN) capsule Take 1 capsule by mouth daily.         . simvastatin (ZOCOR) 20 MG tablet Take 1 tablet (20 mg total) by mouth at bedtime.  30 tablet  11   No current facility-administered medications on file prior to visit.    Review of Systems Review of Systems  Constitutional: Negative for fever, appetite change, fatigue and unexpected weight change.  Eyes: Negative for pain and visual disturbance.  ENt pos for cong and rhinorrhea/ neg for ST or facial pain  Respiratory: Negative for cough and shortness of breath.   Cardiovascular: Negative for cp or palpitations    Gastrointestinal: Negative for nausea, diarrhea and constipation.  Genitourinary: Negative for urgency and frequency.  Skin: Negative for pallor or rash   Neurological: Negative for weakness, light-headedness, numbness and headaches.  Hematological: Negative for adenopathy. Does not bruise/bleed easily.  Psychiatric/Behavioral: Negative for dysphoric mood. The patient is not nervous/anxious.         Objective:   Physical Exam  Constitutional: He appears well-developed and well-nourished. No distress.  HENT:  Head: Normocephalic and atraumatic.  Right Ear: External ear normal.  Left Ear: External ear normal.  Mouth/Throat: Oropharynx is clear and moist. No oropharyngeal exudate.  Nares are injected and congested   Clear rhinorrhea No  facial pain   Eyes: Conjunctivae and EOM are normal. Pupils are equal, round, and reactive to light. Right eye exhibits no discharge. Left eye exhibits no discharge.  Neck: Normal range of motion. Neck supple. No JVD present. Carotid bruit is not present. No thyromegaly present.  Cardiovascular: Normal rate, regular rhythm and intact distal pulses.  Exam reveals no gallop.   Murmur heard. Pulmonary/Chest: Effort normal and breath sounds normal. No respiratory distress. He has no wheezes. He has no rales.  Abdominal: Soft. Bowel sounds are normal. He exhibits no distension, no abdominal bruit and no mass. There is no tenderness.    Musculoskeletal: He exhibits no edema.  Lymphadenopathy:    He has no cervical adenopathy.  Neurological: He is alert. He has normal reflexes. No cranial nerve deficit. He exhibits normal muscle tone. Coordination normal.  Skin: Skin is warm and dry. No rash noted. No erythema. No pallor.  Small plantar wart seen on L heel   Psychiatric: He has a normal mood and affect.          Assessment & Plan:

## 2013-07-21 NOTE — Assessment & Plan Note (Signed)
bp up today- after missing lisinopril  Start back on it Lab today bp has been in fair control No changes needed Disc lifstyle change with low sodium diet and exercise

## 2013-07-21 NOTE — Assessment & Plan Note (Signed)
Pt mentioned this on way out  Disc use of compound W and then soaks/debridement  F/u if no imp

## 2013-07-21 NOTE — Assessment & Plan Note (Signed)
Lipids today- will be up w/o med for 1-2 wk  Disc goals for lipids and reasons to control them Rev labs with pt from last draw  Rev low sat fat diet in detail

## 2013-07-21 NOTE — Assessment & Plan Note (Signed)
With chronic congestion  Add flonase Update if no imp or if facial pain or fever

## 2013-07-22 ENCOUNTER — Encounter: Payer: Self-pay | Admitting: *Deleted

## 2014-06-27 ENCOUNTER — Ambulatory Visit (INDEPENDENT_AMBULATORY_CARE_PROVIDER_SITE_OTHER): Payer: PRIVATE HEALTH INSURANCE | Admitting: Family Medicine

## 2014-06-27 ENCOUNTER — Encounter: Payer: Self-pay | Admitting: Family Medicine

## 2014-06-27 VITALS — BP 135/85 | HR 94 | Temp 98.1°F | Ht 67.25 in | Wt 158.0 lb

## 2014-06-27 DIAGNOSIS — F458 Other somatoform disorders: Secondary | ICD-10-CM

## 2014-06-27 DIAGNOSIS — Z0000 Encounter for general adult medical examination without abnormal findings: Secondary | ICD-10-CM | POA: Diagnosis not present

## 2014-06-27 DIAGNOSIS — E785 Hyperlipidemia, unspecified: Secondary | ICD-10-CM

## 2014-06-27 DIAGNOSIS — Z23 Encounter for immunization: Secondary | ICD-10-CM

## 2014-06-27 DIAGNOSIS — I10 Essential (primary) hypertension: Secondary | ICD-10-CM

## 2014-06-27 DIAGNOSIS — R09A2 Foreign body sensation, throat: Secondary | ICD-10-CM | POA: Insufficient documentation

## 2014-06-27 DIAGNOSIS — R0989 Other specified symptoms and signs involving the circulatory and respiratory systems: Secondary | ICD-10-CM | POA: Insufficient documentation

## 2014-06-27 LAB — CBC WITH DIFFERENTIAL/PLATELET
Basophils Absolute: 0 10*3/uL (ref 0.0–0.1)
Basophils Relative: 0.5 % (ref 0.0–3.0)
EOS ABS: 0.4 10*3/uL (ref 0.0–0.7)
EOS PCT: 5.4 % — AB (ref 0.0–5.0)
HCT: 47.1 % (ref 39.0–52.0)
HEMOGLOBIN: 15.6 g/dL (ref 13.0–17.0)
Lymphocytes Relative: 24.7 % (ref 12.0–46.0)
Lymphs Abs: 1.9 10*3/uL (ref 0.7–4.0)
MCHC: 33.1 g/dL (ref 30.0–36.0)
MCV: 86.7 fl (ref 78.0–100.0)
MONOS PCT: 8.6 % (ref 3.0–12.0)
Monocytes Absolute: 0.7 10*3/uL (ref 0.1–1.0)
NEUTROS ABS: 4.6 10*3/uL (ref 1.4–7.7)
NEUTROS PCT: 60.8 % (ref 43.0–77.0)
Platelets: 338 10*3/uL (ref 150.0–400.0)
RBC: 5.44 Mil/uL (ref 4.22–5.81)
RDW: 12.6 % (ref 11.5–15.5)
WBC: 7.6 10*3/uL (ref 4.0–10.5)

## 2014-06-27 LAB — TSH: TSH: 2.22 u[IU]/mL (ref 0.35–4.50)

## 2014-06-27 MED ORDER — SIMVASTATIN 20 MG PO TABS: 20.0000 mg | ORAL_TABLET | Freq: Every day | ORAL | Status: DC

## 2014-06-27 MED ORDER — LISINOPRIL 10 MG PO TABS: 10.0000 mg | ORAL_TABLET | Freq: Every day | ORAL | Status: DC

## 2014-06-27 NOTE — Progress Notes (Signed)
Pre visit review using our clinic review tool, if applicable. No additional management support is needed unless otherwise documented below in the visit note. 

## 2014-06-27 NOTE — Progress Notes (Signed)
Subjective:    Patient ID: Robert Dickson, male    DOB: [birth year], 33 y.o.   MRN: [id number]  HPI Here for annual exam/ prev care and chronic medical issue   Wants to get a flu shot today  Tdap 2012  Has been feeling ok overall   He always has mucous in his throat  Never smoked  Used to dip- quit that  occ runny nose  Has heartburn occasionally - less than once per week  No sore throat  He takes allegra - does not help a lot   He got shocked this year during work- and had to be hospitalized  He was not hurt badly   bp is up today  No cp or palpitations or headaches or edema  No side effects to medicines  He had med on the way here  Has had nl bp outside the office  BP Readings from Last 3 Encounters:  06/27/14 148/94  07/20/13 158/92  06/28/12 135/85   135/85    Hx of hyperlipidemia  zocor and diet  Lab Results  Component Value Date   CHOL 209* 07/20/2013   HDL 85.30 07/20/2013   LDLCALC 107* 12/02/2010   LDLDIRECT 102.5 07/20/2013   TRIG 193.0* 07/20/2013   CHOLHDL 2 07/20/2013   that was after being out of med  No missed doses now  Not eating as well - "drive through diet"- very hectic lifestyle   Will be able to get to the gym after the holidays  Was going 5 days per week  Also will be able to eat better   Wt is stable with bmi of 24   Patient Active Problem List   Diagnosis Date Noted  . Plantar wart, left foot 07/20/2013  . Routine general medical examination at a health care facility 12/09/2010  . ALLERGIC RHINITIS 04/05/2010  . HYPERLIPIDEMIA 11/11/2006  . HYPERTENSION 11/11/2006  . MURMUR 11/11/2006   Past Medical History  Diagnosis Date  . Hyperlipidemia   . Hypertension   . Allergy     allergic rhinitis  . External hemorrhoid    No past surgical history on file. History  Substance Use Topics  . Smoking status: Former Smoker    Types: Cigarettes  . Smokeless tobacco: Not on file     Comment: social smoker  . Alcohol Use: 0.0  oz/week    0 Not specified per week     Comment: 1-2 drinks/day(a littlem ore in summer)   Family History  Problem Relation Age of Onset  . Hypertension Father    No Known Allergies Current Outpatient Prescriptions on File Prior to Visit  Medication Sig Dispense Refill  . fexofenadine (ALLEGRA) 60 MG tablet OTC as directed.     Marland Kitchen. lisinopril (PRINIVIL,ZESTRIL) 10 MG tablet Take 1 tablet (10 mg total) by mouth daily. 30 tablet 11  . Multiple Vitamin (MULTIVITAMIN) capsule Take 1 capsule by mouth daily.      . simvastatin (ZOCOR) 20 MG tablet Take 1 tablet (20 mg total) by mouth at bedtime. 30 tablet 11   No current facility-administered medications on file prior to visit.    Review of Systems    Review of Systems  Constitutional: Negative for fever, appetite change, fatigue and unexpected weight change.  ENT pos for post nasal drip / throat clearing/ neg for ST Eyes: Negative for pain and visual disturbance.  Respiratory: Negative for wheeze and shortness of breath.   Cardiovascular: Negative for cp or palpitations  Gastrointestinal: Negative for nausea, diarrhea and constipation.  Genitourinary: Negative for urgency and frequency.  Skin: Negative for pallor or rash   Neurological: Negative for weakness, light-headedness, numbness and headaches.  Hematological: Negative for adenopathy. Does not bruise/bleed easily.  Psychiatric/Behavioral: Negative for dysphoric mood. The patient is not nervous/anxious.      Objective:   Physical Exam  Constitutional: He appears well-developed and well-nourished. No distress.  HENT:  Head: Normocephalic and atraumatic.  Right Ear: External ear normal.  Left Ear: External ear normal.  Nose: Nose normal.  Mouth/Throat: Oropharynx is clear and moist.  No rhinorrhea   Eyes: Conjunctivae and EOM are normal. Pupils are equal, round, and reactive to light. Right eye exhibits no discharge. Left eye exhibits no discharge. No scleral icterus.    Neck: Normal range of motion. Neck supple. No JVD present. Carotid bruit is not present. No thyromegaly present.  Cardiovascular: Normal rate, regular rhythm, normal heart sounds and intact distal pulses.  Exam reveals no gallop.   Pulmonary/Chest: Effort normal and breath sounds normal. No respiratory distress. He has no wheezes. He exhibits no tenderness.  Abdominal: Soft. Bowel sounds are normal. He exhibits no distension, no abdominal bruit and no mass. There is no tenderness.  Musculoskeletal: He exhibits no edema or tenderness.  Lymphadenopathy:    He has no cervical adenopathy.  Neurological: He is alert. He has normal reflexes. No cranial nerve deficit. He exhibits normal muscle tone. Coordination normal.  Skin: Skin is warm and dry. No rash noted. No erythema. No pallor.  Psychiatric: He has a normal mood and affect.          Assessment & Plan:   Problem List Items Addressed This Visit      Cardiovascular and Mediastinum   Essential hypertension    bp in fair control at this time  BP Readings from Last 1 Encounters:  06/27/14 135/85  (better on 2nd check) No changes needed-continue ace  Disc lifstyle change with low sodium diet and exercise   Labs today    Relevant Medications      lisinopril (PRINIVIL,ZESTRIL) tablet      simvastatin (ZOCOR) tablet     Other   Globus sensation    Disc poss of post nasal drip vs gerd  Will try zyrtec and also zantac for 2 wk and update  If not imp -would ref to ENT in light of hx of dipping tobacco     Hyperlipidemia    On zocor and diet Rev low sat fat diet=will work on this  Labs today    Relevant Medications      lisinopril (PRINIVIL,ZESTRIL) tablet      simvastatin (ZOCOR) tablet   Routine general medical examination at a health care facility - Primary    Reviewed health habits including diet and exercise and skin cancer prevention Reviewed appropriate screening tests for age  Also reviewed health mt list, fam hx and  immunization status , as well as social and family history    Flu vaccine today  Labs today Disc plan for better diet and exercise     Relevant Orders      CBC with Differential (Completed)      Comprehensive metabolic panel (Completed)      TSH (Completed)      Lipid panel (Completed)    Other Visit Diagnoses    Need for prophylactic vaccination and inoculation against influenza        Relevant Orders  Flu Vaccine QUAD 36+ mos PF IM (Fluarix Quad PF) (Completed)

## 2014-06-27 NOTE — Patient Instructions (Signed)
For throat mucous - change allegra to zyrtec (generic is fine) - and take daily  Also get zantac 150 mg and take it twice daily for at least 2 weeks  If symptoms are not gone in 2 weeks = please let me know and we will refer you to ENT Keep watching blood pressure  Try to get back to exercise  Flu shot today  Labs today

## 2014-06-28 ENCOUNTER — Telehealth: Payer: Self-pay | Admitting: Family Medicine

## 2014-06-28 LAB — LIPID PANEL
CHOLESTEROL: 213 mg/dL — AB (ref 0–200)
HDL: 82 mg/dL (ref >39.00)
LDL Cholesterol: 112 mg/dL — ABNORMAL HIGH (ref 0–99)
NonHDL: 131
TRIGLYCERIDES: 94 mg/dL (ref 0.0–149.0)
Total CHOL/HDL Ratio: 3
VLDL: 18.8 mg/dL (ref 0.0–40.0)

## 2014-06-28 LAB — COMPREHENSIVE METABOLIC PANEL
ALBUMIN: 4.9 g/dL (ref 3.5–5.2)
ALT: 25 U/L (ref 0–53)
AST: 30 U/L (ref 0–37)
Alkaline Phosphatase: 65 U/L (ref 39–117)
BUN: 10 mg/dL (ref 6–23)
CALCIUM: 10 mg/dL (ref 8.4–10.5)
CO2: 23 meq/L (ref 19–32)
CREATININE: 1.2 mg/dL (ref 0.4–1.5)
Chloride: 101 mEq/L (ref 96–112)
GFR: 73.18 mL/min (ref >60.00)
GLUCOSE: 98 mg/dL (ref 70–99)
Potassium: 5 mEq/L (ref 3.5–5.1)
Sodium: 136 mEq/L (ref 135–145)
Total Bilirubin: 0.5 mg/dL (ref 0.2–1.2)
Total Protein: 8 g/dL (ref 6.0–8.3)

## 2014-06-28 NOTE — Assessment & Plan Note (Signed)
Reviewed health habits including diet and exercise and skin cancer prevention Reviewed appropriate screening tests for age  Also reviewed health mt list, fam hx and immunization status , as well as social and family history    Flu vaccine today  Labs today Disc plan for better diet and exercise

## 2014-06-28 NOTE — Assessment & Plan Note (Signed)
Disc poss of post nasal drip vs gerd  Will try zyrtec and also zantac for 2 wk and update  If not imp -would ref to ENT in light of hx of dipping tobacco

## 2014-06-28 NOTE — Assessment & Plan Note (Signed)
bp in fair control at this time  BP Readings from Last 1 Encounters:  06/27/14 135/85  (better on 2nd check) No changes needed-continue ace  Disc lifstyle change with low sodium diet and exercise   Labs today

## 2014-06-28 NOTE — Assessment & Plan Note (Signed)
On zocor and diet Rev low sat fat diet=will work on Intel[employer]  Labs today

## 2014-06-28 NOTE — Telephone Encounter (Signed)
emmi emailed °

## 2014-06-29 ENCOUNTER — Encounter: Payer: Self-pay | Admitting: *Deleted

## 2015-04-27 ENCOUNTER — Encounter: Payer: Self-pay | Admitting: Family Medicine

## 2015-04-27 ENCOUNTER — Ambulatory Visit (INDEPENDENT_AMBULATORY_CARE_PROVIDER_SITE_OTHER): Payer: PRIVATE HEALTH INSURANCE | Admitting: Family Medicine

## 2015-04-27 VITALS — BP 142/96 | HR 78 | Temp 98.2°F | Ht 67.25 in | Wt 166.5 lb

## 2015-04-27 DIAGNOSIS — R05 Cough: Secondary | ICD-10-CM

## 2015-04-27 DIAGNOSIS — R059 Cough, unspecified: Secondary | ICD-10-CM | POA: Insufficient documentation

## 2015-04-27 MED ORDER — AMLODIPINE BESYLATE 5 MG PO TABS: 5.0000 mg | ORAL_TABLET | Freq: Every day | ORAL | Status: DC

## 2015-04-27 MED ORDER — ALBUTEROL SULFATE HFA 108 (90 BASE) MCG/ACT IN AERS: 2.0000 | INHALATION_SPRAY | RESPIRATORY_TRACT | Status: DC | PRN

## 2015-04-27 MED ORDER — BENZONATATE 200 MG PO CAPS: 200.0000 mg | ORAL_CAPSULE | Freq: Three times a day (TID) | ORAL | Status: DC | PRN

## 2015-04-27 NOTE — Progress Notes (Signed)
Subjective:    Patient ID: Robert Dickson, male    DOB: 1981-01-29, 34 y.o.   MRN: [id number]  HPI Here with cough and nasal congestion   Started with cough - prod at time (mucous yellow to green) Also nasal congestion  Felt like he had a fever  At first ST and ears hurt   Cannot fully shake it - cough will not go away  Almost wheezy at times  Non smoker   This started first back in June - and was tx with zpak  Eventually got better   This episode started about 1-2 wk ago   bp is up - not taking care of himself  Recent divorce   Patient Active Problem List   Diagnosis Date Noted  . Cough 04/27/2015  . Globus sensation 06/27/2014  . Plantar wart, left foot 07/20/2013  . Routine general medical examination at a health care facility 12/09/2010  . ALLERGIC RHINITIS 04/05/2010  . Hyperlipidemia 11/11/2006  . Essential hypertension 11/11/2006  . MURMUR 11/11/2006   Past Medical History  Diagnosis Date  . Hyperlipidemia   . Hypertension   . Allergy     allergic rhinitis  . External hemorrhoid    No past surgical history on file. Social History  Substance Use Topics  . Smoking status: Never Smoker   . Smokeless tobacco: Former Neurosurgeon  . Alcohol Use: 0.0 oz/week    0 Standard drinks or equivalent per week     Comment: 1-2 drinks/day(a littlem ore in summer)   Family History  Problem Relation Age of Onset  . Hypertension Father    Allergies  Allergen Reactions  . Lisinopril Cough    ? cough   Current Outpatient Prescriptions on File Prior to Visit  Medication Sig Dispense Refill  . Multiple Vitamin (MULTIVITAMIN) capsule Take 1 capsule by mouth daily.      . simvastatin (ZOCOR) 20 MG tablet Take 1 tablet (20 mg total) by mouth at bedtime. 30 tablet 11   No current facility-administered medications on file prior to visit.     Review of Systems Review of Systems  Constitutional: Negative for fever, appetite change,  and unexpected weight change.  Eyes:  Negative for pain and visual disturbance.  Respiratory: Negative for  shortness of breath.  pos for persistent tight feeling cough Cardiovascular: Negative for cp or palpitations    Gastrointestinal: Negative for nausea, diarrhea and constipation.  Genitourinary: Negative for urgency and frequency.  Skin: Negative for pallor or rash   Neurological: Negative for weakness, light-headedness, numbness and headaches.  Hematological: Negative for adenopathy. Does not bruise/bleed easily.  Psychiatric/Behavioral: Negative for dysphoric mood. The patient is not nervous/anxious.         Objective:   Physical Exam  Constitutional: He appears well-developed and well-nourished. No distress.  HENT:  Head: Normocephalic and atraumatic.  Right Ear: External ear normal.  Left Ear: External ear normal.  Mouth/Throat: Oropharynx is clear and moist.  Nares are injected and congested  No sinus tenderness Clear rhinorrhea and post nasal drip   Eyes: Conjunctivae and EOM are normal. Pupils are equal, round, and reactive to light. Right eye exhibits no discharge. Left eye exhibits no discharge.  Neck: Normal range of motion. Neck supple. No JVD present. Carotid bruit is not present. No thyromegaly present.  Cardiovascular: Normal rate, regular rhythm, normal heart sounds and intact distal pulses.  Exam reveals no gallop.   Pulmonary/Chest: Effort normal and breath sounds normal. No respiratory distress. He  has no wheezes. He has no rales. He exhibits no tenderness.  No crackles or rales   Harsh dry cough  Abdominal: Soft. He exhibits no abdominal bruit and no mass.  Musculoskeletal: He exhibits no edema.  Lymphadenopathy:    He has no cervical adenopathy.  Neurological: He is alert. He has normal reflexes.  Skin: Skin is warm and dry. No rash noted.  Psychiatric: He has a normal mood and affect.          Assessment & Plan:   Problem List Items Addressed This Visit      Other   Cough -  Primary    Ongoing after a uri/ pt also on ace  ? If ACE cough as well  Will change ace to amlodipine for bp and sched fu (disc pos side eff)   Albuterol prn for tightness/wheeze-inst in technique given  Tessalon tid for cough as well   Update if not starting to improve in a week or if worsening

## 2015-04-27 NOTE — Patient Instructions (Signed)
Stop lisinopril ? May make you cough more  When tight/wheezy-use the albuterol  Try the tessalon pills three times daily  Continue the DM cough medicine over the counter  Drink lots of water   Update if not starting to improve in a week or if worsening    Follow up with me in 3-4 weeks

## 2015-04-27 NOTE — Progress Notes (Signed)
Pre visit review using our clinic review tool, if applicable. No additional management support is needed unless otherwise documented below in the visit note. 

## 2015-04-29 NOTE — Assessment & Plan Note (Signed)
Ongoing after a uri/ pt also on ace  ? If ACE cough as well  Will change ace to amlodipine for bp and sched fu (disc pos side eff)   Albuterol prn for tightness/wheeze-inst in technique given  Tessalon tid for cough as well   Update if not starting to improve in a week or if worsening

## 2015-05-18 ENCOUNTER — Ambulatory Visit (INDEPENDENT_AMBULATORY_CARE_PROVIDER_SITE_OTHER): Payer: PRIVATE HEALTH INSURANCE | Admitting: Family Medicine

## 2015-05-18 ENCOUNTER — Encounter: Payer: Self-pay | Admitting: Family Medicine

## 2015-05-18 VITALS — BP 166/98 | HR 87 | Temp 98.2°F | Ht 67.25 in | Wt 165.5 lb

## 2015-05-18 DIAGNOSIS — I10 Essential (primary) hypertension: Secondary | ICD-10-CM | POA: Diagnosis not present

## 2015-05-18 MED ORDER — SIMVASTATIN 20 MG PO TABS: 20.0000 mg | ORAL_TABLET | Freq: Every day | ORAL | Status: DC

## 2015-05-18 MED ORDER — AMLODIPINE BESYLATE 10 MG PO TABS: 10.0000 mg | ORAL_TABLET | Freq: Every day | ORAL | Status: DC

## 2015-05-18 NOTE — Progress Notes (Signed)
Subjective:    Patient ID: Robert Dickson, male    DOB: [birth year], 34 y.o.   MRN: [id number]  HPI Cough is hit or miss now -definitely improved (a few days of not coughing)  Usually dry  occ a bit of post nasal drip   Changed from ace to amlodipine  BP Readings from Last 3 Encounters:  05/18/15 166/98  04/27/15 142/96  06/27/14 135/85    Has not checked outside the office  No side eff from amlodipine   Patient Active Problem List   Diagnosis Date Noted  . Globus sensation 06/27/2014  . Plantar wart, left foot 07/20/2013  . Routine general medical examination at a health care facility 12/09/2010  . ALLERGIC RHINITIS 04/05/2010  . Hyperlipidemia 11/11/2006  . Essential hypertension 11/11/2006  . MURMUR 11/11/2006   Past Medical History  Diagnosis Date  . Hyperlipidemia   . Hypertension   . Allergy     allergic rhinitis  . External hemorrhoid    No past surgical history on file. Social History  Substance Use Topics  . Smoking status: Never Smoker   . Smokeless tobacco: Former NeurosurgeonUser  . Alcohol Use: 0.0 oz/week    0 Standard drinks or equivalent per week     Comment: 1-2 drinks/day(a littlem ore in summer)   Family History  Problem Relation Age of Onset  . Hypertension Father    Allergies  Allergen Reactions  . Lisinopril Cough    ? cough   Current Outpatient Prescriptions on File Prior to Visit  Medication Sig Dispense Refill  . albuterol (PROVENTIL HFA;VENTOLIN HFA) 108 (90 BASE) MCG/ACT inhaler Inhale 2 puffs into the lungs every 4 (four) hours as needed for wheezing. 1 Inhaler 3  . benzonatate (TESSALON) 200 MG capsule Take 1 capsule (200 mg total) by mouth 3 (three) times daily as needed for cough (do not bite pill, swallow whole). 30 capsule 1  . Multiple Vitamin (MULTIVITAMIN) capsule Take 1 capsule by mouth daily.       No current facility-administered medications on file prior to visit.     Review of Systems    Review of Systems  Constitutional:  Negative for fever, appetite change, fatigue and unexpected weight change.  Eyes: Negative for pain and visual disturbance.  Respiratory: Negative for cough and shortness of breath.   Cardiovascular: Negative for cp or palpitations    Gastrointestinal: Negative for nausea, diarrhea and constipation.  Genitourinary: Negative for urgency and frequency.  Skin: Negative for pallor or rash   Neurological: Negative for weakness, light-headedness, numbness and headaches.  Hematological: Negative for adenopathy. Does not bruise/bleed easily.  Psychiatric/Behavioral: Negative for dysphoric mood. The patient is not nervous/anxious.      Objective:   Physical Exam  Constitutional: He appears well-developed and well-nourished. No distress.  HENT:  Head: Normocephalic and atraumatic.  Mouth/Throat: Oropharynx is clear and moist.  Eyes: Conjunctivae and EOM are normal. Pupils are equal, round, and reactive to light.  Neck: Normal range of motion. Neck supple. No JVD present. Carotid bruit is not present. No thyromegaly present.  Cardiovascular: Normal rate, regular rhythm, normal heart sounds and intact distal pulses.  Exam reveals no gallop.   Pulmonary/Chest: Effort normal and breath sounds normal. No respiratory distress. He has no wheezes. He has no rales.  No crackles  Abdominal: Soft. Bowel sounds are normal. He exhibits no distension, no abdominal bruit and no mass. There is no tenderness.  Musculoskeletal: He exhibits no edema.  Lymphadenopathy:  He has no cervical adenopathy.  Neurological: He is alert. He has normal reflexes.  Skin: Skin is warm and dry. No rash noted.  Psychiatric: He has a normal mood and affect.          Assessment & Plan:   Problem List Items Addressed This Visit      Cardiovascular and Mediastinum   Essential hypertension - Primary    Recently d/c ace due to cough and that is improving (also that may have been multifactorial) Not at goal with amlodipine  5 so will inc to 10  Rev lifestyle habits (not opt) -enc DASH eating plan and exercise  F/u around 8 weeks Will update if side effects       Relevant Medications   amLODipine (NORVASC) 10 MG tablet   simvastatin (ZOCOR) 20 MG tablet

## 2015-05-18 NOTE — Progress Notes (Signed)
Pre visit review using our clinic review tool, if applicable. No additional management support is needed unless otherwise documented below in the visit note. 

## 2015-05-18 NOTE — Patient Instructions (Signed)
Blood pressure is up but I'm glad your cough is improving  Increase amlodipine to 10 mg once daily (that is 2 of the 5 mg once daily until you get the new px 10 mg)  Try to take care of yourself Watch salt in diet (see DASH diet)   Follow up in 2 months   Check your blood pressure outside the office if you can (while relaxed)

## 2015-05-20 NOTE — Assessment & Plan Note (Signed)
Recently d/c ace due to cough and that is improving (also that may have been multifactorial) Not at goal with amlodipine 5 so will inc to 10  Rev lifestyle habits (not opt) -enc DASH eating plan and exercise  F/u around 8 weeks Will update if side effects

## 2015-07-20 ENCOUNTER — Encounter (INDEPENDENT_AMBULATORY_CARE_PROVIDER_SITE_OTHER): Payer: Self-pay

## 2015-07-20 ENCOUNTER — Ambulatory Visit (INDEPENDENT_AMBULATORY_CARE_PROVIDER_SITE_OTHER): Payer: PRIVATE HEALTH INSURANCE | Admitting: Family Medicine

## 2015-07-20 ENCOUNTER — Other Ambulatory Visit: Payer: Self-pay | Admitting: Family Medicine

## 2015-07-20 ENCOUNTER — Encounter: Payer: Self-pay | Admitting: Family Medicine

## 2015-07-20 VITALS — BP 135/90 | HR 96 | Temp 98.0°F | Ht 67.25 in | Wt 172.8 lb

## 2015-07-20 DIAGNOSIS — I10 Essential (primary) hypertension: Secondary | ICD-10-CM | POA: Diagnosis not present

## 2015-07-20 DIAGNOSIS — E785 Hyperlipidemia, unspecified: Secondary | ICD-10-CM | POA: Diagnosis not present

## 2015-07-20 NOTE — Assessment & Plan Note (Signed)
Will check lipids at next visit- as pt has not taken simvastatin in a week (forgot to pick it up) Enc better compliance Rev low sat fat diet  F/u 1 mo lab prior

## 2015-07-20 NOTE — Progress Notes (Signed)
Pre visit review using our clinic review tool, if applicable. No additional management support is needed unless otherwise documented below in the visit note. 

## 2015-07-20 NOTE — Progress Notes (Signed)
Subjective:    Patient ID: Robert Dickson, male    DOB: Jun 05, 1981, 34 y.o.   MRN: [id number]  HPI Here for f/u of chronic medical problems   bp is still up  today  No cp or palpitations or headaches or edema  No side effects to medicines  BP Readings from Last 3 Encounters:  07/20/15 154/98  05/18/15 166/98  04/27/15 142/96    2nd check 135/90  Last time increased amlodipine to 10 mg  Has not checked bp outside the office   Wt is up 7 lb with bmi 26- has not been eating well/ in midst of divorce No time for exercise - but just did renew gym membership   Due for labs  Has not taken simvastatin in 1 wk and ate this am  Forgot to pick it up Diet is not optimal   Patient Active Problem List   Diagnosis Date Noted  . Globus sensation 06/27/2014  . Plantar wart, left foot 07/20/2013  . Routine general medical examination at a health care facility 12/09/2010  . ALLERGIC RHINITIS 04/05/2010  . Hyperlipidemia 11/11/2006  . Essential hypertension 11/11/2006  . MURMUR 11/11/2006   Past Medical History  Diagnosis Date  . Hyperlipidemia   . Hypertension   . Allergy     allergic rhinitis  . External hemorrhoid    No past surgical history on file. Social History  Substance Use Topics  . Smoking status: Never Smoker   . Smokeless tobacco: Former Neurosurgeon  . Alcohol Use: 0.0 oz/week    0 Standard drinks or equivalent per week     Comment: 1-2 drinks/day(a littlem ore in summer)   Family History  Problem Relation Age of Onset  . Hypertension Father    Allergies  Allergen Reactions  . Lisinopril Cough    ? cough   Current Outpatient Prescriptions on File Prior to Visit  Medication Sig Dispense Refill  . albuterol (PROVENTIL HFA;VENTOLIN HFA) 108 (90 BASE) MCG/ACT inhaler Inhale 2 puffs into the lungs every 4 (four) hours as needed for wheezing. 1 Inhaler 3  . amLODipine (NORVASC) 10 MG tablet Take 1 tablet (10 mg total) by mouth daily. 30 tablet 11  . Multiple Vitamin  (MULTIVITAMIN) capsule Take 1 capsule by mouth daily.      . simvastatin (ZOCOR) 20 MG tablet Take 1 tablet (20 mg total) by mouth at bedtime. 30 tablet 11   No current facility-administered medications on file prior to visit.     Review of Systems Review of Systems  Constitutional: Negative for fever, appetite change, fatigue and unexpected weight change.  Eyes: Negative for pain and visual disturbance.  Respiratory: Negative for cough and shortness of breath.   Cardiovascular: Negative for cp or palpitations    Gastrointestinal: Negative for nausea, diarrhea and constipation.  Genitourinary: Negative for urgency and frequency.  Skin: Negative for pallor or rash   Neurological: Negative for weakness, light-headedness, numbness and headaches.  Hematological: Negative for adenopathy. Does not bruise/bleed easily.  Psychiatric/Behavioral: Negative for dysphoric mood. The patient is not nervous/anxious.  pos for stressors        Objective:   Physical Exam  Constitutional: He appears well-developed and well-nourished. No distress.  Well appearing   HENT:  Head: Normocephalic and atraumatic.  Mouth/Throat: Oropharynx is clear and moist.  Eyes: Conjunctivae and EOM are normal. Pupils are equal, round, and reactive to light.  Neck: Normal range of motion. Neck supple. No JVD present. Carotid bruit is not  present. No thyromegaly present.  Cardiovascular: Normal rate, regular rhythm, normal heart sounds and intact distal pulses.  Exam reveals no gallop.   Pulmonary/Chest: Effort normal and breath sounds normal. No respiratory distress. He has no wheezes. He has no rales.  No crackles  Abdominal: Soft. Bowel sounds are normal. He exhibits no distension, no abdominal bruit and no mass. There is no tenderness.  Musculoskeletal: He exhibits no edema.  Lymphadenopathy:    He has no cervical adenopathy.  Neurological: He is alert. He has normal reflexes. No cranial nerve deficit. He exhibits  normal muscle tone. Coordination normal.  Skin: Skin is warm and dry. No rash noted.  Psychiatric: He has a normal mood and affect.          Assessment & Plan:   Problem List Items Addressed This Visit      Cardiovascular and Mediastinum   Essential hypertension - Primary    Improved with inc of amlodipne to 10 mg - but not at goal BP: 135/90 mmHg   Pt wants to work on exercise and dash eating - just joined a gym Lot of stress with divorce also  F/u 1 mo -will do lab prior Add tx if needed        Relevant Orders   Comprehensive metabolic panel     Other   Hyperlipidemia    Will check lipids at next visit- as pt has not taken simvastatin in a week (forgot to pick it up) Enc better compliance Rev low sat fat diet  F/u 1 mo lab prior      Relevant Orders   Lipid panel

## 2015-07-20 NOTE — Patient Instructions (Signed)
Blood pressure is improved but not quite at goal Today 135/90 on 2nd check  Get back to exercise when you can - will also help with stress Try to eat a low sodium diet (avoid processed foods)- and look at the [facility] eating plan I gave you  Follow up in approx 1 month with labs prior  Get back on simvastatin

## 2015-07-20 NOTE — Assessment & Plan Note (Signed)
Improved with inc of amlodipne to 10 mg - but not at goal BP: 135/90 mmHg   Pt wants to work on exercise and dash eating - just joined a gym Lot of stress with divorce also  F/u 1 mo -will do lab prior Add tx if needed

## 2015-08-04 ENCOUNTER — Inpatient Hospital Stay
Admission: EM | Admit: 2015-08-04 | Discharge: 2015-08-06 | DRG: 494 | Disposition: A | Discharge status: Home / Self Care | Payer: No Typology Code available for payment source | Attending: Orthopedic Surgery | Admitting: Orthopedic Surgery

## 2015-08-04 ENCOUNTER — Inpatient Hospital Stay: Payer: No Typology Code available for payment source

## 2015-08-04 ENCOUNTER — Emergency Department: Payer: No Typology Code available for payment source

## 2015-08-04 ENCOUNTER — Encounter: Payer: Self-pay | Admitting: Emergency Medicine

## 2015-08-04 DIAGNOSIS — Y92093 Driveway of other non-institutional residence as the place of occurrence of the external cause: Secondary | ICD-10-CM

## 2015-08-04 DIAGNOSIS — Z888 Allergy status to other drugs, medicaments and biological substances status: Secondary | ICD-10-CM

## 2015-08-04 DIAGNOSIS — Z8781 Personal history of (healed) traumatic fracture: Secondary | ICD-10-CM

## 2015-08-04 DIAGNOSIS — S99911A Unspecified injury of right ankle, initial encounter: Secondary | ICD-10-CM | POA: Diagnosis present

## 2015-08-04 DIAGNOSIS — W000XXA Fall on same level due to ice and snow, initial encounter: Secondary | ICD-10-CM | POA: Diagnosis present

## 2015-08-04 DIAGNOSIS — S82851A Displaced trimalleolar fracture of right lower leg, initial encounter for closed fracture: Secondary | ICD-10-CM

## 2015-08-04 DIAGNOSIS — Z87891 Personal history of nicotine dependence: Secondary | ICD-10-CM

## 2015-08-04 DIAGNOSIS — E785 Hyperlipidemia, unspecified: Secondary | ICD-10-CM | POA: Diagnosis present

## 2015-08-04 DIAGNOSIS — W009XXA Unspecified fall due to ice and snow, initial encounter: Secondary | ICD-10-CM

## 2015-08-04 DIAGNOSIS — I10 Essential (primary) hypertension: Secondary | ICD-10-CM | POA: Diagnosis present

## 2015-08-04 DIAGNOSIS — Z9889 Other specified postprocedural states: Secondary | ICD-10-CM

## 2015-08-04 DIAGNOSIS — Z419 Encounter for procedure for purposes other than remedying health state, unspecified: Secondary | ICD-10-CM

## 2015-08-04 LAB — BASIC METABOLIC PANEL
ANION GAP: 9 (ref 5–15)
BUN: 8 mg/dL (ref 6–20)
CALCIUM: 9.2 mg/dL (ref 8.9–10.3)
CO2: 25 mmol/L (ref 22–32)
CREATININE: 1.02 mg/dL (ref 0.61–1.24)
Chloride: 99 mmol/L — ABNORMAL LOW (ref 101–111)
GFR calc Af Amer: >60 mL/min (ref >60)
GLUCOSE: 106 mg/dL — AB (ref 65–99)
Potassium: 3.9 mmol/L (ref 3.5–5.1)
Sodium: 133 mmol/L — ABNORMAL LOW (ref 135–145)

## 2015-08-04 LAB — CBC
HEMATOCRIT: 45.6 % (ref 40.0–52.0)
Hemoglobin: 15.1 g/dL (ref 13.0–18.0)
MCH: 28.5 pg (ref 26.0–34.0)
MCHC: 33.1 g/dL (ref 32.0–36.0)
MCV: 86.3 fL (ref 80.0–100.0)
PLATELETS: 344 10*3/uL (ref 150–440)
RBC: 5.29 MIL/uL (ref 4.40–5.90)
RDW: 13.4 % (ref 11.5–14.5)
WBC: 18.5 10*3/uL — AB (ref 3.8–10.6)

## 2015-08-04 LAB — PROTIME-INR
INR: 1
Prothrombin Time: 13.4 seconds (ref 11.4–15.0)

## 2015-08-04 LAB — ABO/RH: ABO/RH(D): O POS

## 2015-08-04 LAB — TYPE AND SCREEN
ABO/RH(D): O POS
Antibody Screen: NEGATIVE

## 2015-08-04 LAB — APTT: aPTT: 24 seconds (ref 24–36)

## 2015-08-04 MED ORDER — SODIUM CHLORIDE 0.9 % IV SOLN
INTRAVENOUS | Status: DC
  Administered 2015-08-04 – 2015-08-05 (×3): via INTRAVENOUS

## 2015-08-04 MED ORDER — OXYCODONE-ACETAMINOPHEN 5-325 MG PO TABS
1.0000 | ORAL_TABLET | Freq: Once | ORAL | Status: AC
  Administered 2015-08-04: 1 via ORAL
  Filled 2015-08-04: qty 1

## 2015-08-04 MED ORDER — OXYCODONE HCL 5 MG PO TABS
5.0000 mg | ORAL_TABLET | ORAL | Status: DC | PRN
  Administered 2015-08-05: 5 mg via ORAL
  Administered 2015-08-05 (×2): 10 mg via ORAL
  Administered 2015-08-05 (×2): 5 mg via ORAL
  Administered 2015-08-06 (×3): 10 mg via ORAL
  Filled 2015-08-04: qty 1
  Filled 2015-08-04: qty 2
  Filled 2015-08-04 (×2): qty 1
  Filled 2015-08-04 (×4): qty 2

## 2015-08-04 MED ORDER — MORPHINE SULFATE (PF) 2 MG/ML IV SOLN
2.0000 mg | INTRAVENOUS | Status: DC | PRN
  Administered 2015-08-04 – 2015-08-06 (×5): 2 mg via INTRAVENOUS
  Filled 2015-08-04 (×5): qty 1

## 2015-08-04 MED ORDER — CEFAZOLIN SODIUM-DEXTROSE 2-3 GM-% IV SOLR
2.0000 g | INTRAVENOUS | Status: AC
  Administered 2015-08-05: 2 g via INTRAVENOUS
  Filled 2015-08-04 (×3): qty 50

## 2015-08-04 MED ORDER — ONDANSETRON HCL 4 MG/2ML IJ SOLN
4.0000 mg | Freq: Once | INTRAMUSCULAR | Status: AC
  Administered 2015-08-04: 4 mg via INTRAVENOUS
  Filled 2015-08-04: qty 2

## 2015-08-04 MED ORDER — SODIUM CHLORIDE 0.9 % IV BOLUS (SEPSIS)
1000.0000 mL | Freq: Once | INTRAVENOUS | Status: AC
Start: 2015-08-04 — End: 2015-08-04
  Administered 2015-08-04: 1000 mL via INTRAVENOUS

## 2015-08-04 MED ORDER — HYDROMORPHONE HCL 1 MG/ML IJ SOLN
1.0000 mg | Freq: Once | INTRAMUSCULAR | Status: AC
  Administered 2015-08-04: 1 mg via INTRAVENOUS
  Filled 2015-08-04: qty 1

## 2015-08-04 NOTE — ED Notes (Signed)
Pt presents to ED via [facility] for c/o ankle injury. Pt states he was in his driveway when he slipped and twisted his R ankle. Pt presents with obvious swelling to R ankle. Per EMS pt has 18G in L AC and has had of fentanyl.

## 2015-08-04 NOTE — ED Provider Notes (Signed)
[facility] Emergency Department Provider Note  ____________________________________________  Time seen: Approximately 6:54 PM  I have reviewed the triage vital signs and the nursing notes.   HISTORY  Chief Complaint Ankle Injury    HPI Jacqualine CodeDaniel K Zelman is a 35 y.o. male with a history of HTN NHL presented with right ankle pain. Patient reports that he slipped on the ice and had acute pain in the right ankle, then "lay down." Unable to ambulate.  He denies any loss of consciousness, knee pain, hip pain. No neck or back pain. No numbness or tingling.   Past Medical History  Diagnosis Date  . Hyperlipidemia   . Hypertension   . Allergy     allergic rhinitis  . External hemorrhoid     Patient Active Problem List   Diagnosis Date Noted  . Globus sensation 06/27/2014  . Plantar wart, left foot 07/20/2013  . Routine general medical examination at a health care facility 12/09/2010  . ALLERGIC RHINITIS 04/05/2010  . Hyperlipidemia 11/11/2006  . Essential hypertension 11/11/2006  . MURMUR 11/11/2006    History reviewed. No pertinent past surgical history.  Current Outpatient Rx  Name  Route  Sig  Dispense  Refill  . albuterol (PROVENTIL HFA;VENTOLIN HFA) 108 (90 BASE) MCG/ACT inhaler   Inhalation   Inhale 2 puffs into the lungs every 4 (four) hours as needed for wheezing.   1 Inhaler   3   . amLODipine (NORVASC) 10 MG tablet   Oral   Take 1 tablet (10 mg total) by mouth daily.   30 tablet   11   . Multiple Vitamin (MULTIVITAMIN) capsule   Oral   Take 1 capsule by mouth daily.           . simvastatin (ZOCOR) 20 MG tablet      TAKE ONE TABLET BY MOUTH AT BEDTIME   30 tablet   10     Allergies Lisinopril  Family History  Problem Relation Age of Onset  . Hypertension Father     Social History Social History  Substance Use Topics  . Smoking status: Never Smoker   . Smokeless tobacco: Former NeurosurgeonUser  . Alcohol Use: 0.0 oz/week    0  Standard drinks or equivalent per week     Comment: 1-2 drinks/day(a littlem ore in summer)    Review of Systems Constitutional: No fever/chills. No lightheadedness or syncope. Eyes: No visual changes. ENT: Dental injury. Cardiovascular: Denies chest pain, palpitations. Respiratory: Denies shortness of breath.  No cough. Gastrointestinal: No abdominal pain.  No nausea, no vomiting.  No diarrhea.  No constipation. Musculoskeletal: Negative for back pain. Negative for neck pain. Positive for right ankle pain. Negative for right knee or hip pain. Skin: Negative for rash. Neurological: Negative for headaches, focal weakness or numbness.  10-point ROS otherwise negative.  ____________________________________________   PHYSICAL EXAM:  VITAL SIGNS: ED Triage Vitals  Enc Vitals Group     BP 08/04/15 1832 142/98 mmHg     Pulse Rate 08/04/15 1832 89     Resp 08/04/15 1832 18     Temp 08/04/15 1832 98.3 F (36.8 C)     Temp Source 08/04/15 1832 Oral     SpO2 08/04/15 1832 96 %     Weight 08/04/15 1832 173 lb (78.472 kg)     Height 08/04/15 1832 5\' 7"  (1.702 m)     Head Cir --      Peak Flow --      Pain  Score 08/04/15 1834 7     Pain Loc --      Pain Edu? --      Excl. in GC? --     Constitutional: Alert and oriented. Well appearing and in no acute distress. Answer question appropriately. Eyes: Conjunctivae are normal.  EOMI. Head: Atraumatic. Nose: No congestion/rhinnorhea. Mouth/Throat: Mucous membranes are moist.  Neck: No stridor.  Supple.  Full range of motion without pain. Cardiovascular: Normal rate,   Respiratory: Normal respiratory effort.   Musculoskeletal: R ankle with medial and lateral malleolar swelling and bruising.  Cap refill <2 sec.  Normal DP and PT pulse R.  Skin intact.  Full ROM w/o pain of the R knee and hip. Neurologic:  Normal speech and language. No gross focal neurologic deficits are appreciated.  Skin:  Skin is warm, dry and intact. No rash  noted. Psychiatric: Mood and affect are normal. Speech and behavior are normal.  Normal judgement.  ____________________________________________   LABS (all labs ordered are listed, but only abnormal results are displayed)  Labs Reviewed  CBC  BASIC METABOLIC PANEL  APTT  PROTIME-INR  TYPE AND SCREEN   ____________________________________________  EKG  Not indicated ____________________________________________  RADIOLOGY  Dg Ankle Complete Right  08/04/2015  CLINICAL DATA:  Right ankle pain status post fall. EXAM: RIGHT ANKLE - COMPLETE 3+ VIEW COMPARISON:  None. FINDINGS: There is a complex try malleolar fracture of the right ankle with spiral comminuted fracture of the distal fibula with mild distruction, comminuted transverse fracture of the lateral malleolus with moderate distruction of the distal fracture fragment, and minimally displaced longitudinal fracture of the posterior malleolus. Ankle mortise is disrupted with intra-articular extension of all the fracture lines. There is an associated joint effusion and soft tissue swelling. IMPRESSION: Complex try malleolar intraarticular fracture of the right ankle. Electronically Signed   By: Ted Mcalpine M.D.   On: 08/04/2015 19:09    ____________________________________________   PROCEDURES  Procedure(s) performed: None  Critical Care performed: No ____________________________________________   INITIAL IMPRESSION / ASSESSMENT AND PLAN / ED COURSE  Pertinent labs & imaging results that were available during my care of the patient were reviewed by me and considered in my medical decision making (see chart for details).  35 y.o. s/p slipping on ice w/ swelling and bruising isolated to R ankle.  No evid of neurovasc compromise.  Eval for fx versus sprain, r/o dislocation.  ----------------------------------------- 7:27 PM on 08/04/2015 -----------------------------------------  The patient has a trimalleolar  fracture in the right ankle. I spoken with Dr. Rosita Kea will admit the patient for operative treatment tomorrow. We will put a posterior splint on the ankle and get a CT scan for preoperative planning. ____________________________________________  FINAL CLINICAL IMPRESSION(S) / ED DIAGNOSES  Final diagnoses:  Trimalleolar fracture, right, closed, initial encounter  Fall from slipping on ice, initial encounter      NEW MEDICATIONS STARTED DURING THIS VISIT:  New Prescriptions   No medications on file     Rockne Menghini, MD 08/04/15 1927

## 2015-08-04 NOTE — ED Notes (Signed)
Report given to Jordan,RN

## 2015-08-04 NOTE — H&P (Signed)
Subjective:   Patient is a 35 y.o. male presents with right ankle pain. Onset of symptoms was abrupt starting 3 hours ago with unchanged course since that time. The pain is located all around the ankle. Patient describes the pain as sharp continuous and rated as moderate. 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  Previous studies include x-ray showing trimalleolar ankle fracture CT showing comminuted fibula fracture with a small posterior rim fracture of the tibia.  Patient Active Problem List   Diagnosis Date Noted  . Trimalleolar fracture 08/04/2015  . Globus sensation 06/27/2014  . Plantar wart, left foot 07/20/2013  . Routine general medical examination at a health care facility 12/09/2010  . ALLERGIC RHINITIS 04/05/2010  . Hyperlipidemia 11/11/2006  . Essential hypertension 11/11/2006  . MURMUR 11/11/2006   Past Medical History  Diagnosis Date  . Hyperlipidemia   . Hypertension   . Allergy     allergic rhinitis  . External hemorrhoid     History reviewed. No pertinent past surgical history.  Prescriptions prior to admission  Medication Sig Dispense Refill Last Dose  . albuterol (PROVENTIL HFA;VENTOLIN HFA) 108 (90 BASE) MCG/ACT inhaler Inhale 2 puffs into the lungs every 4 (four) hours as needed for wheezing. 1 Inhaler 3 prn  . amLODipine (NORVASC) 10 MG tablet Take 1 tablet (10 mg total) by mouth daily. 30 tablet 11 08/03/2015 at Unknown time  . simvastatin (ZOCOR) 20 MG tablet TAKE ONE TABLET BY MOUTH AT BEDTIME 30 tablet 10 08/03/2015 at Unknown time  . Multiple Vitamin (MULTIVITAMIN) capsule Take 1 capsule by mouth daily.     Taking   Allergies  Allergen Reactions  . Lisinopril Cough    ? cough    Social History  Substance Use Topics  . Smoking  status: Never Smoker   . Smokeless tobacco: Former NeurosurgeonUser  . Alcohol Use: 0.0 oz/week    0 Standard drinks or equivalent per week     Comment: 1-2 drinks/day(a littlem ore in summer)    Family History  Problem Relation Age of Onset  . Hypertension Father     Review of Systems Pertinent items are noted in HPI.  Objective:   Patient Vitals for the past 8 hrs:  BP Temp Temp src Pulse Resp SpO2 Height Weight  08/04/15 2053 113/80 mmHg 98.3 F (36.8 C) Oral 84 - 96 % - 77.565 kg (171 lb)  08/04/15 1832 (!) 142/98 mmHg 98.3 F (36.8 C) Oral 89 18 96 % 5\' 7"  (1.702 m) 78.472 kg (173 lb)          BP 113/80 mmHg  Pulse 84  Temp(Src) 98.3 F (36.8 C) (Oral)  Resp 18  Ht 5\' 7"  (1.702 m)  Wt 77.565 kg (171 lb)  BMI 26.78 kg/m2  SpO2 96% General appearance: alert, cooperative, appears stated age and mild distress Lungs: clear to auscultation bilaterally Heart: regular rate and rhythm, S1, S2 normal, no murmur, click, rub or gallop Extremities: Splints applied to right lower leg, brisk capillary refill with intact sensation   Data ReviewRadiology review: Comminuted small medial malleolar fragment anteriorly with posterior malleolar rim fracture and complex fibula fracture involving the syndesmosis  Assessment:   Active Problems:   Trimalleolar fracture   Plan:   Plans  for ORIF tomorrow, risks benefits possible competitions discussed in particular infection and deep vein thrombosis. Perioperative antibiotics ordered. Discussed need to stay nonweightbearing 6 weeks and probably 3 months before he is resuming work

## 2015-08-05 ENCOUNTER — Inpatient Hospital Stay: Payer: No Typology Code available for payment source

## 2015-08-05 ENCOUNTER — Encounter
Admission: EM | Disposition: A | Discharge status: Home / Self Care | Payer: Self-pay | Source: Home / Self Care | Attending: Orthopedic Surgery

## 2015-08-05 ENCOUNTER — Inpatient Hospital Stay: Payer: No Typology Code available for payment source | Admitting: Anesthesiology

## 2015-08-05 HISTORY — PX: ORIF ANKLE FRACTURE: SHX5408

## 2015-08-05 LAB — CBC
HCT: 42.9 % (ref 40.0–52.0)
Hemoglobin: 14.1 g/dL (ref 13.0–18.0)
MCH: 28.7 pg (ref 26.0–34.0)
MCHC: 32.8 g/dL (ref 32.0–36.0)
MCV: 87.4 fL (ref 80.0–100.0)
PLATELETS: 300 10*3/uL (ref 150–440)
RBC: 4.91 MIL/uL (ref 4.40–5.90)
RDW: 13.4 % (ref 11.5–14.5)
WBC: 9.9 10*3/uL (ref 3.8–10.6)

## 2015-08-05 LAB — MRSA PCR SCREENING: MRSA by PCR: NEGATIVE

## 2015-08-05 LAB — CREATININE, SERUM: CREATININE: 1.22 mg/dL (ref 0.61–1.24)

## 2015-08-05 SURGERY — OPEN REDUCTION INTERNAL FIXATION (ORIF) ANKLE FRACTURE
Anesthesia: General | Site: Ankle | Laterality: Right | Wound class: Clean

## 2015-08-05 MED ORDER — SIMVASTATIN 20 MG PO TABS
20.0000 mg | ORAL_TABLET | Freq: Every day | ORAL | Status: DC
  Administered 2015-08-05: 20 mg via ORAL
  Filled 2015-08-05: qty 1

## 2015-08-05 MED ORDER — NEOMYCIN-POLYMYXIN B GU 40-200000 IR SOLN
Status: DC | PRN
  Administered 2015-08-05: 2 mL

## 2015-08-05 MED ORDER — MIDAZOLAM HCL 5 MG/5ML IJ SOLN
INTRAMUSCULAR | Status: DC | PRN
  Administered 2015-08-05: 2 mg via INTRAVENOUS
  Administered 2015-08-05: 1 mg via INTRAVENOUS

## 2015-08-05 MED ORDER — ONDANSETRON HCL 4 MG/2ML IJ SOLN: 4.0000 mg | Freq: Once | INTRAMUSCULAR | Status: DC | PRN

## 2015-08-05 MED ORDER — ENOXAPARIN SODIUM 40 MG/0.4ML ~~LOC~~ SOLN
40.0000 mg | SUBCUTANEOUS | Status: DC
Start: 2015-08-06 — End: 2015-08-06
  Administered 2015-08-06: 40 mg via SUBCUTANEOUS
  Filled 2015-08-05: qty 0.4

## 2015-08-05 MED ORDER — FENTANYL CITRATE (PF) 100 MCG/2ML IJ SOLN
INTRAMUSCULAR | Status: DC | PRN
  Administered 2015-08-05: 25 ug via INTRAVENOUS

## 2015-08-05 MED ORDER — METHOCARBAMOL 1000 MG/10ML IJ SOLN
500.0000 mg | Freq: Four times a day (QID) | INTRAVENOUS | Status: DC | PRN
  Filled 2015-08-05: qty 5

## 2015-08-05 MED ORDER — HYDROMORPHONE HCL 1 MG/ML IJ SOLN
0.5000 mg | Freq: Once | INTRAMUSCULAR | Status: AC
  Administered 2015-08-05: 0.5 mg via INTRAVENOUS
  Filled 2015-08-05: qty 1

## 2015-08-05 MED ORDER — AMLODIPINE BESYLATE 10 MG PO TABS
10.0000 mg | ORAL_TABLET | Freq: Every day | ORAL | Status: DC
  Administered 2015-08-05: 10 mg via ORAL
  Filled 2015-08-05: qty 1

## 2015-08-05 MED ORDER — FENTANYL CITRATE (PF) 100 MCG/2ML IJ SOLN: 25.0000 ug | INTRAMUSCULAR | Status: DC | PRN

## 2015-08-05 MED ORDER — METHOCARBAMOL 500 MG PO TABS: 500.0000 mg | ORAL_TABLET | Freq: Four times a day (QID) | ORAL | Status: DC | PRN

## 2015-08-05 MED ORDER — METOCLOPRAMIDE HCL 5 MG/ML IJ SOLN: 5.0000 mg | Freq: Three times a day (TID) | INTRAMUSCULAR | Status: DC | PRN

## 2015-08-05 MED ORDER — MAGNESIUM HYDROXIDE 400 MG/5ML PO SUSP
30.0000 mL | Freq: Every day | ORAL | Status: DC | PRN
Start: 2015-08-05 — End: 2015-08-06

## 2015-08-05 MED ORDER — ACETAMINOPHEN 325 MG PO TABS: 650.0000 mg | ORAL_TABLET | Freq: Four times a day (QID) | ORAL | Status: DC | PRN

## 2015-08-05 MED ORDER — METOCLOPRAMIDE HCL 5 MG PO TABS: 5.0000 mg | ORAL_TABLET | Freq: Three times a day (TID) | ORAL | Status: DC | PRN

## 2015-08-05 MED ORDER — BISACODYL 5 MG PO TBEC
5.0000 mg | DELAYED_RELEASE_TABLET | Freq: Every day | ORAL | Status: DC | PRN
  Administered 2015-08-06: 5 mg via ORAL
  Filled 2015-08-05: qty 1

## 2015-08-05 MED ORDER — ALBUTEROL SULFATE (2.5 MG/3ML) 0.083% IN NEBU: 3.0000 mL | INHALATION_SOLUTION | RESPIRATORY_TRACT | Status: DC | PRN

## 2015-08-05 MED ORDER — SODIUM CHLORIDE 0.9 % IV SOLN
INTRAVENOUS | Status: DC
  Administered 2015-08-05 – 2015-08-06 (×2): via INTRAVENOUS

## 2015-08-05 MED ORDER — ACETAMINOPHEN 650 MG RE SUPP: 650.0000 mg | Freq: Four times a day (QID) | RECTAL | Status: DC | PRN

## 2015-08-05 MED ORDER — ONDANSETRON HCL 4 MG/2ML IJ SOLN: 4.0000 mg | Freq: Four times a day (QID) | INTRAMUSCULAR | Status: DC | PRN

## 2015-08-05 MED ORDER — CEFAZOLIN SODIUM-DEXTROSE 2-3 GM-% IV SOLR
2.0000 g | Freq: Four times a day (QID) | INTRAVENOUS | Status: AC
  Administered 2015-08-05 – 2015-08-06 (×3): 2 g via INTRAVENOUS
  Filled 2015-08-05 (×3): qty 50

## 2015-08-05 MED ORDER — SODIUM CHLORIDE 0.9 % IV SOLN
INTRAVENOUS | Status: DC | PRN
  Administered 2015-08-05 (×2): via INTRAVENOUS

## 2015-08-05 MED ORDER — PROPOFOL 500 MG/50ML IV EMUL
INTRAVENOUS | Status: DC | PRN
  Administered 2015-08-05: 75 ug/kg/min via INTRAVENOUS

## 2015-08-05 MED ORDER — DOCUSATE SODIUM 100 MG PO CAPS
100.0000 mg | ORAL_CAPSULE | Freq: Two times a day (BID) | ORAL | Status: DC
  Administered 2015-08-05 – 2015-08-06 (×3): 100 mg via ORAL
  Filled 2015-08-05 (×3): qty 1

## 2015-08-05 MED ORDER — ONDANSETRON HCL 4 MG PO TABS: 4.0000 mg | ORAL_TABLET | Freq: Four times a day (QID) | ORAL | Status: DC | PRN

## 2015-08-05 MED ORDER — ZOLPIDEM TARTRATE 5 MG PO TABS: 5.0000 mg | ORAL_TABLET | Freq: Every evening | ORAL | Status: DC | PRN

## 2015-08-05 SURGICAL SUPPLY — 71 items
4 hole one third tubular plate ×2 IMPLANT
BANDAGE ACE 4X5 VEL STRL LF (GAUZE/BANDAGES/DRESSINGS) ×4 IMPLANT
BANDAGE ELASTIC 4 CLIP NS LF (GAUZE/BANDAGES/DRESSINGS) ×2 IMPLANT
BIT DRILL 2.5X2.75 QC CALB (BIT) ×2 IMPLANT
BIT DRILL 2.9 CANN QC NONSTRL (BIT) ×2 IMPLANT
BIT DRILL CALIBRATED 2.7 (BIT) ×1 IMPLANT
BIT DRILL CALIBRATED 2.7MM (BIT) ×1
BLADE SURG SZ10 CARB STEEL (BLADE) ×6 IMPLANT
BNDG ESMARK 4X12 TAN STRL LF (GAUZE/BANDAGES/DRESSINGS) ×3 IMPLANT
CANISTER SUCT 1200ML W/VALVE (MISCELLANEOUS) ×3 IMPLANT
CAST PADDING 6X4YD ST 30248 (SOFTGOODS) ×2
CHLORAPREP W/TINT 26ML (MISCELLANEOUS) ×3 IMPLANT
DRAPE C-ARMOR (DRAPES) ×2 IMPLANT
DRAPE FLUOR MINI C-ARM 54X84 (DRAPES) ×3 IMPLANT
DRAPE INCISE IOBAN 66X45 STRL (DRAPES) ×3 IMPLANT
DRAPE U-SHAPE 47X51 STRL (DRAPES) ×3 IMPLANT
DRSG EMULSION OIL 3X8 NADH (GAUZE/BANDAGES/DRESSINGS) ×1 IMPLANT
ELECT CAUTERY BLADE 6.4 (BLADE) ×3 IMPLANT
GAUZE PETRO XEROFOAM 1X8 (MISCELLANEOUS) ×3 IMPLANT
GAUZE SPONGE 4X4 12PLY STRL (GAUZE/BANDAGES/DRESSINGS) ×3 IMPLANT
GAUZE XEROFORM 4X4 STRL (GAUZE/BANDAGES/DRESSINGS) ×2 IMPLANT
GLOVE BIOGEL PI IND STRL 9 (GLOVE) ×1 IMPLANT
GLOVE BIOGEL PI INDICATOR 9 (GLOVE) ×2
GLOVE INDICATOR 7.5 STRL GRN (GLOVE) ×3 IMPLANT
GLOVE SURG ORTHO 9.0 STRL STRW (GLOVE) ×3 IMPLANT
GOWN SPECIALTY ULTRA XL (MISCELLANEOUS) ×3 IMPLANT
GOWN STRL REUS W/ TWL LRG LVL3 (GOWN DISPOSABLE) ×1 IMPLANT
GOWN STRL REUS W/TWL LRG LVL3 (GOWN DISPOSABLE) ×3
HEMOVAC 400ML (MISCELLANEOUS)
K-WIRE ACE 1.6X6 (WIRE) ×9
KIT DRAIN HEMOVAC JP 7FR 400ML (MISCELLANEOUS) ×1 IMPLANT
KIT RM TURNOVER STRD PROC AR (KITS) ×3 IMPLANT
KWIRE ACE 1.6X6 (WIRE) IMPLANT
LABEL OR SOLS (LABEL) ×3 IMPLANT
NS IRRIG 1000ML POUR BTL (IV SOLUTION) ×3 IMPLANT
PACK EXTREMITY ARMC (MISCELLANEOUS) ×3 IMPLANT
PAD ABD DERMACEA PRESS 5X9 (GAUZE/BANDAGES/DRESSINGS) ×4 IMPLANT
PAD CAST CTTN 4X4 STRL (SOFTGOODS) ×2 IMPLANT
PAD GROUND ADULT SPLIT (MISCELLANEOUS) ×3 IMPLANT
PAD PREP 24X41 OB/GYN DISP (PERSONAL CARE ITEMS) ×3 IMPLANT
PADDING CAST COTTON 4X4 STRL (SOFTGOODS) ×6
PADDING CAST COTTON 6X4 ST (SOFTGOODS) IMPLANT
PLATE LOCK 8H 103 BILAT FIB (Plate) ×2 IMPLANT
PLATE TUB 100DEG 5 HO (Plate) ×2 IMPLANT
SCREW ACE CAN 4.0 30M (Screw) ×2 IMPLANT
SCREW CORTICAL 3.5MM  28MM (Screw) ×4 IMPLANT
SCREW CORTICAL 3.5MM  30MM (Screw) ×2 IMPLANT
SCREW CORTICAL 3.5MM 24MM (Screw) ×2 IMPLANT
SCREW CORTICAL 3.5MM 26MM (Screw) ×2 IMPLANT
SCREW CORTICAL 3.5MM 28MM (Screw) IMPLANT
SCREW CORTICAL 3.5MM 30MM (Screw) IMPLANT
SCREW LOCK CORT STAR 3.5X14 (Screw) ×4 IMPLANT
SCREW LOCK CORT STAR 3.5X16 (Screw) ×4 IMPLANT
SCREW LOCK CORT STAR 3.5X18 (Screw) ×2 IMPLANT
SCREW NLOCK CANC HEX 4X35 (Screw) ×4 IMPLANT
SCREW NON LOCKING LP 3.5 14MM (Screw) ×2 IMPLANT
SCREW NON LOCKING LP 3.5 16MM (Screw) ×4 IMPLANT
SPLINT CAST 1 STEP 3X12 (MISCELLANEOUS) ×4 IMPLANT
SPLINT CAST 1 STEP 5X30 WHT (MISCELLANEOUS) ×1 IMPLANT
SPONGE LAP 18X18 5 PK (GAUZE/BANDAGES/DRESSINGS) ×1 IMPLANT
STAPLER SKIN PROX 35W (STAPLE) ×3 IMPLANT
STOCKINETTE STRL 6IN 960660 (GAUZE/BANDAGES/DRESSINGS) ×3 IMPLANT
SUT ETHILON 3-0 FS-10 30 BLK (SUTURE)
SUT MNCRL AB 4-0 PS2 18 (SUTURE) ×2 IMPLANT
SUT VIC AB 0 CT1 36 (SUTURE) ×1 IMPLANT
SUT VIC AB 2-0 SH 27 (SUTURE) ×3
SUT VIC AB 2-0 SH 27XBRD (SUTURE) ×2 IMPLANT
SUT VIC AB 3-0 SH 27 (SUTURE)
SUT VIC AB 3-0 SH 27X BRD (SUTURE) ×1 IMPLANT
SUTURE EHLN 3-0 FS-10 30 BLK (SUTURE) ×1 IMPLANT
SYRINGE 10CC LL (SYRINGE) ×3 IMPLANT

## 2015-08-05 NOTE — OR Nursing (Signed)
In & Out cath with of clear yellow urine drained.

## 2015-08-05 NOTE — Anesthesia Postprocedure Evaluation (Signed)
Anesthesia Post Note  Patient: Robert Dickson  Procedure(s) Performed: Procedure(s) (LRB): OPEN REDUCTION INTERNAL FIXATION (ORIF) ANKLE FRACTURE (Right)  Patient location during evaluation: PACU Anesthesia Type: General Level of consciousness: awake and alert Pain management: pain level controlled Vital Signs Assessment: post-procedure vital signs reviewed and stable Respiratory status: spontaneous breathing, nonlabored ventilation, respiratory function stable and patient connected to nasal cannula oxygen Cardiovascular status: blood pressure returned to baseline and stable Postop Assessment: no signs of nausea or vomiting Anesthetic complications: no    Last Vitals:  Filed Vitals:   08/05/15 1655 08/05/15 1705  BP: 129/91   Pulse: 79 71  Temp:    Resp: 20 17    Last Pain:  Filed Vitals:   08/05/15 1715  PainSc: 0-No pain                 Lenard SimmerAndrew Charley Miske

## 2015-08-05 NOTE — Anesthesia Procedure Notes (Addendum)
Spinal Patient location during procedure: OR Start time: 08/05/2015 2:12 PM Staffing Anesthesiologist: Vashti Hey G Performed by: anesthesiologist  Preanesthetic Checklist Completed: patient identified, site marked, surgical consent, pre-op evaluation, timeout performed, IV checked, risks and benefits discussed and monitors and equipment checked Spinal Block Patient position: sitting Prep: Betadine Patient monitoring: heart rate, continuous pulse ox, blood pressure and cardiac monitor Approach: midline Location: L4-5 Injection technique: single-shot Needle Needle type: Whitacre and Introducer  Needle gauge: 24 G Needle length: 9 cm Assessment Sensory level: T6 Additional Notes Negative paresthesia. Negative blood return. Positive free-flowing CSF. Expiration date of kit checked and confirmed. Patient tolerated procedure well, without complications.

## 2015-08-05 NOTE — Progress Notes (Signed)
After review of all the CT images I discussed with the patient has agreed to do posterior malleolar repair as well will have that done at time of surgery because of potential for posterior and syndesmotic instability

## 2015-08-05 NOTE — Op Note (Signed)
08/04/2015 - 08/05/2015  4:28 PM  PATIENT:  Robert Dickson  35 y.o. male  PRE-OPERATIVE DIAGNOSIS:  right ankle fracture  POST-OPERATIVE DIAGNOSIS:  right ankle trimalleolar fracture  PROCEDURE:  Procedure(s): OPEN REDUCTION INTERNAL FIXATION (ORIF) ANKLE FRACTURE (Right) ORIF medial lateral and posterior malleolar SURGEON: Leitha SchullerMichael J Quintyn Dombek, MD  ASSISTANTS: None  ANESTHESIA:   spinal  EBL:  Total I/O In: 1000 [I.V.:1000] Out: 1000 [Urine:1000]  BLOOD ADMINISTERED:none  DRAINS: none   LOCAL MEDICATIONS USED:  NONE  SPECIMEN:  No Specimen  DISPOSITION OF SPECIMEN:  N/A  COUNTS:  YES  TOURNIQUET:   88 minutes at 300 mmHg  IMPLANTS: Biomet third tubular locking plates 2 with screws composite locking fibular plate with multiple screws and one 4.0 cannulated screw  DICTATION: .Dragon Dictation patient brought the operating room and after spinal anesthesia was obtained the patient was placed prone. After prepping draping the right lower leg and appropriate patient identification and timeout procedures were completed. Tourniquet was raised to 3 mmHg and a posterior lateral approach was made between the Achilles tendon and peroneal tendons with care being taken to preserve the sural nerve and branches. The FHL muscle belly was identified and elevated off the posterior aspect of the tibia and two third tubular plates were contoured and then fixed to the distal first posterior tibia to fix medial and lateral posterior malleolar fragments and reduce the syndesmosis. Next the stability was checked in the syndesmosis appeared stable with this maneuver with 2 plates with multiple screws holding the posterior fragments in and on posterior stress testing the ankle was stable. The fibula was reduced and held with a reduction clamp as a composite plate was applied with nonlocking screws proximally and locking screws distally to give anatomic alignment to the fibula. After these screws had been placed  the medial side was approached through an anterior medial incision. The incision was carried out and the fracture site exposed the fracture fragment was fairly small. K wires inserted and a single 4.0 cannulated screw could be inserted over the guidewire this gave full range of motion of the ankle with good stability and anterior posterior drawer as well as stressing the syndesmosis under fluoroscopy. Tourniquet was let down and the wounds thoroughly irrigated. Wounds were closed with 2-0 Vicryl subcutaneously and skin staples. Xeroform 4 x 4's web roll and stirrup splint applied followed by an Ace wrap  PLAN OF CARE: Continue as inpatient  PATIENT DISPOSITION:  PACU - hemodynamically stable.

## 2015-08-05 NOTE — Progress Notes (Signed)
Pts. Pain is 10 out of 10. Dr. Rosita KeaMenz ordered 0.5mg  dilaudid once and to repeat x1 if no relief.

## 2015-08-05 NOTE — Anesthesia Preprocedure Evaluation (Signed)
Anesthesia Evaluation  Patient identified by MRN, date of birth, ID band Patient awake    Reviewed: Allergy & Precautions, H&P , NPO status , Patient's Chart, lab work & pertinent test results, reviewed documented beta blocker date and time   Airway Mallampati: II  TM Distance: >3 FB Neck ROM: full    Dental no notable dental hx.    Pulmonary neg pulmonary ROS,    Pulmonary exam normal breath sounds clear to auscultation       Cardiovascular Exercise Tolerance: Good hypertension, negative cardio ROS  + Valvular Problems/Murmurs  Rhythm:regular Rate:Normal     Neuro/Psych negative neurological ROS  negative psych ROS   GI/Hepatic negative GI ROS, Neg liver ROS,   Endo/Other  negative endocrine ROS  Renal/GU negative Renal ROS  negative genitourinary   Musculoskeletal   Abdominal   Peds  Hematology negative hematology ROS (+)   Anesthesia Other Findings   Reproductive/Obstetrics negative OB ROS                             Anesthesia Physical Anesthesia Plan  ASA: II and emergent  Anesthesia Plan: General and General ETT   Post-op Pain Management:    Induction:   Airway Management Planned:   Additional Equipment:   Intra-op Plan:   Post-operative Plan:   Informed Consent: I have reviewed the patients History and Physical, chart, labs and discussed the procedure including the risks, benefits and alternatives for the proposed anesthesia with the patient or authorized representative who has indicated his/her understanding and acceptance.   Dental Advisory Given  Plan Discussed with: CRNA  Anesthesia Plan Comments:         Anesthesia Quick Evaluation

## 2015-08-05 NOTE — Transfer of Care (Signed)
Immediate Anesthesia Transfer of Care Note  Patient: Robert Dickson  Procedure(s) Performed: Procedure(s): OPEN REDUCTION INTERNAL FIXATION (ORIF) ANKLE FRACTURE (Right)  Patient Location: PACU  Anesthesia Type:Spinal  Level of Consciousness: awake  Airway & Oxygen Therapy: Patient Spontanous Breathing  Post-op Assessment: Report given to RN and Post -op Vital signs reviewed and stable  Post vital signs: Reviewed and stable  Last Vitals:  Filed Vitals:   08/05/15 0758 08/05/15 1622  BP: 128/91 84/55  Pulse: 71   Temp: 36.7 C 36.5 C  Resp: 16 14    Complications: No apparent anesthesia complications

## 2015-08-06 ENCOUNTER — Encounter: Payer: Self-pay | Admitting: Orthopedic Surgery

## 2015-08-06 MED ORDER — KETOROLAC TROMETHAMINE 30 MG/ML IJ SOLN
30.0000 mg | Freq: Once | INTRAMUSCULAR | Status: AC
  Administered 2015-08-06: 30 mg via INTRAVENOUS
  Filled 2015-08-06: qty 1

## 2015-08-06 MED ORDER — ASPIRIN EC 325 MG PO TBEC: 325.0000 mg | DELAYED_RELEASE_TABLET | Freq: Every day | ORAL | Status: DC

## 2015-08-06 MED ORDER — OXYCODONE HCL 5 MG PO TABS: 5.0000 mg | ORAL_TABLET | ORAL | Status: DC | PRN

## 2015-08-06 MED ORDER — TRAMADOL HCL 50 MG PO TABS
50.0000 mg | ORAL_TABLET | ORAL | Status: DC | PRN
  Administered 2015-08-06: 50 mg via ORAL
  Administered 2015-08-06: 100 mg via ORAL
  Filled 2015-08-06: qty 2
  Filled 2015-08-06: qty 1

## 2015-08-06 NOTE — Discharge Instructions (Signed)
INSTRUCTIONS AFTER Surgery  o Remove items at home which could result in a fall. This includes throw rugs or furniture in walking pathways o ICE to the affected joint every three hours while awake for 30 minutes at a time, for at least the first 3-5 days, and then as needed for pain and swelling.  Continue to use ice for pain and swelling. You may notice swelling that will progress down to the foot and ankle.  This is normal after surgery.  Elevate your leg when you are not up walking on it.   o Continue to use the breathing machine you got in the hospital (incentive spirometer) which will help keep your temperature down.  It is common for your temperature to cycle up and down following surgery, especially at night when you are not up moving around and exerting yourself.  The breathing machine keeps your lungs expanded and your temperature down.   DIET:  As you were doing prior to hospitalization, we recommend a well-balanced diet.  DRESSING / WOUND CARE / SHOWERING  No showering with keeping the ankle protected from water. The appendages or cast will be left in place and protected.  ACTIVITY  o Increase activity slowly as tolerated, but follow the weight bearing instructions below.   o No driving for 6 weeks or until further direction given by your physician.  You cannot drive while taking narcotics.  o No lifting or carrying greater than 10 lbs. until further directed by your surgeon. o Avoid periods of inactivity such as sitting longer than an hour when not asleep. This helps prevent blood clots.  o You may return to work once you are authorized by your doctor.     WEIGHT BEARING  Toe-touch weightbearing with crutches or a walker to limit pressure on the right ankle.   EXERCISES Ambulation as tolerated.  CONSTIPATION  Constipation is defined medically as fewer than three stools per week and severe constipation as less than one stool per week.  Even if you have a regular bowel  pattern at home, your normal regimen is likely to be disrupted due to multiple reasons following surgery.  Combination of anesthesia, postoperative narcotics, change in appetite and fluid intake all can affect your bowels.   YOU MUST use at least one of the following options; they are listed in order of increasing strength to get the job done.  They are all available over the counter, and you may need to use some, POSSIBLY even all of these options:    Drink plenty of fluids (prune juice may be helpful) and high fiber foods Colace 100 mg by mouth twice a day  Senokot for constipation as directed and as needed Dulcolax (bisacodyl), take with full glass of water  Miralax (polyethylene glycol) once or twice a day as needed.  If you have tried all these things and are unable to have a bowel movement in the first 3-4 days after surgery call either your surgeon or your primary doctor.    If you experience loose stools or diarrhea, hold the medications until you stool forms back up.  If your symptoms do not get better within 1 week or if they get worse, check with your doctor.  If you experience "the worst abdominal pain ever" or develop nausea or vomiting, please contact the office immediately for further recommendations for treatment.   ITCHING:  If you experience itching with your medications, try taking only a single pain pill, or even half a pain  pill at a time.  You can also use Benadryl over the counter for itching or also to help with sleep.   TED HOSE STOCKINGS:  Use stockings on both legs until for at least 2 weeks or as directed by physician office. They may be removed at night for sleeping.  MEDICATIONS:  See your medication summary on the After Visit Summary that nursing will review with you.  You may have some home medications which will be placed on hold until you complete the course of blood thinner medication.  It is important for you to complete the blood thinner medication as  prescribed.  PRECAUTIONS:  If you experience chest pain or shortness of breath - call 911 immediately for transfer to the hospital emergency department.   If you develop a fever greater that 101 F, purulent drainage from wound, increased redness or drainage from wound, foul odor from the wound/dressing, or calf pain - CONTACT YOUR SURGEON.                                                   FOLLOW-UP APPOINTMENTS:  If you do not already have a post-op appointment, please call the office for an appointment to be seen by your surgeon.  Guidelines for how soon to be seen are listed in your After Visit Summary, but are typically between 1-4 weeks after surgery.  OTHER INSTRUCTIONS:   Keep your leg elevated and apply ice to decrease swelling.  MAKE SURE YOU:   Understand these instructions.   Get help right away if you are not doing well or get worse.    Thank you for letting us be a part of your medical care team.  It is a privilege we respect greatly.  We hope these instructions will help you stay on track for a fast and full recovery!

## 2015-08-06 NOTE — Discharge Summary (Signed)
Physician Discharge Summary  Subjective: 1 Day Post-Op Procedure(s) (LRB): OPEN REDUCTION INTERNAL FIXATION (ORIF) ANKLE FRACTURE (Right) Patient reports pain as moderate.   Patient seen in rounds with Dr. Rosita KeaMenz. Patient is well, and has had no acute complaints or problems Patient is ready to go home.  Physician Discharge Summary  Patient ID: Robert Dickson MRN: [id number] DOB/AGE: 1980/12/22 35 y.o.  Admit date: 08/04/2015 Discharge date: 08/06/2015  Admission Diagnoses:  Discharge Diagnoses:  Active Problems:   Trimalleolar fracture   Discharged Condition: fair  Hospital Course: The patient is postop day 1 status post right ankle trimalleolar fracture with ORIF. The patient is working on pain management. The patient has had his leg elevated and with ice. And some drainage along the incisions with reinforcement. He was to get up with physical therapy on postop day 1 and most likely go home today after physical therapy.  Treatments: surgery:  PROCEDURE: Procedure(s): OPEN REDUCTION INTERNAL FIXATION (ORIF) ANKLE FRACTURE (Right) ORIF medial lateral and posterior malleolar SURGEON: Leitha SchullerMichael J Menz, MD  ASSISTANTS: None  ANESTHESIA: spinal  EBL: Total I/O In: 1000 [I.V.:1000] Out: 1000 [Urine:1000]  BLOOD ADMINISTERED:none  DRAINS: none   LOCAL MEDICATIONS USED: NONE  SPECIMEN: No Specimen  DISPOSITION OF SPECIMEN: N/A  COUNTS: YES  TOURNIQUET:  88 minutes at 300 mmHg  IMPLANTS: Biomet third tubular locking plates 2 with screws composite locking fibular plate with multiple screws and one 4.0 cannulated screw  Discharge Exam: Blood pressure 118/80, pulse 81, temperature 99.6 F (37.6 C), temperature source Oral, resp. rate 18, height 5\' 7"  (1.702 m), weight 77.565 kg (171 lb), SpO2 96 %.   Disposition: Final discharge disposition not confirmed     Medication List    TAKE these medications        aspirin EC 325 MG tablet  Take 1 tablet (325 mg  total) by mouth daily.     oxyCODONE 5 MG immediate release tablet  Commonly known as:  Oxy IR/ROXICODONE  Take 1-2 tablets (5-10 mg total) by mouth every 4 (four) hours as needed for moderate pain.      ASK your doctor about these medications        albuterol 108 (90 Base) MCG/ACT inhaler  Commonly known as:  PROVENTIL HFA;VENTOLIN HFA  Inhale 2 puffs into the lungs every 4 (four) hours as needed for wheezing.     amLODipine 10 MG tablet  Commonly known as:  NORVASC  Take 1 tablet (10 mg total) by mouth daily.     multivitamin capsule  Take 1 capsule by mouth daily.     simvastatin 20 MG tablet  Commonly known as:  ZOCOR  TAKE ONE TABLET BY MOUTH AT BEDTIME           Follow-up Information    Follow up In 10 days.   Why:  For wound re-check and possible staple for suture removal      Signed: [name] 08/06/2015, 7:39 AM   Objective: Vital signs in last 24 hours: Temp:  [97.5 F (36.4 C)-99.6 F (37.6 C)] 99.6 F (37.6 C) (01/09 0736) Pulse Rate:  [58-89] 81 (01/09 0736) Resp:  [11-20] 18 (01/09 0736) BP: (84-134)/(55-97) 118/80 mmHg (01/09 0736) SpO2:  [93 %-100 %] 96 % (01/09 0736)  Intake/Output from previous day:  Intake/Output Summary (Last 24 hours) at 08/06/15 0739 Last data filed at 08/06/15 [id number]  Gross per 24 hour  Intake 2233.75 ml  Output   1750 ml  Net 483.75 ml  Intake/Output this shift:    Labs:  Recent Labs  08/04/15 1935 08/05/15 1756  HGB 15.1 14.1    Recent Labs  08/04/15 1935 08/05/15 1756  WBC 18.5* 9.9  RBC 5.29 4.91  HCT 45.6 42.9  PLT 344 300    Recent Labs  08/04/15 1935 08/05/15 1756  NA 133*  --   K 3.9  --   CL 99*  --   CO2 25  --   BUN 8  --   CREATININE 1.02 1.22  GLUCOSE 106*  --   CALCIUM 9.2  --     Recent Labs  08/04/15 1935  INR 1.00    EXAM: General - Patient is Alert and Oriented Extremity - Neurovascular intact Sensation intact distally Incision - tender, blood tinged  drainage Motor Function -  the patient was able to do a straight leg raise. He ambulated with toe-touch weight-bear with physical therapy.  Assessment/Plan: 1 Day Post-Op Procedure(s) (LRB): OPEN REDUCTION INTERNAL FIXATION (ORIF) ANKLE FRACTURE (Right) Procedure(s) (LRB): OPEN REDUCTION INTERNAL FIXATION (ORIF) ANKLE FRACTURE (Right) Past Medical History  Diagnosis Date  . Hyperlipidemia   . Hypertension   . Allergy     allergic rhinitis  . External hemorrhoid    Active Problems:   Trimalleolar fracture  Estimated body mass index is 26.78 kg/(m^2) as calculated from the following:   Height as of this encounter: 5\' 7"  (1.702 m).   Weight as of this encounter: 77.565 kg (171 lb). Up with physical therapy. And on discharge home today after physical therapy and if pain is under control.  Diet - Regular diet Follow up - in 3 days for dressing change and cast application. Activity - toe-touch weight-bear on the right. Disposition - Home today. Condition Upon Discharge - Stable DVT Prophylaxis - Aspirin  Dedra Skeens, PA-C Orthopaedic Surgery 08/06/2015, 7:39 AM

## 2015-08-06 NOTE — Progress Notes (Signed)
DISCHARGE NOTE:  Pt given discharge instructions and prescriptions. Pt verbalized understanding. Pt wheeled to car by staff.  

## 2015-08-06 NOTE — Progress Notes (Addendum)
  Subjective: 1 Day Post-Op Procedure(s) (LRB): OPEN REDUCTION INTERNAL FIXATION (ORIF) ANKLE FRACTURE (Right) Patient reports pain as severe.   Patient seen in rounds with Dr. Rosita KeaMenz. Patient is well, and has had no acute complaints or problems. They're still working on his pain control. Plan is to go Home after hospital stay. Negative for chest pain and shortness of breath Fever: no Gastrointestinal: Negative for nausea and vomiting  Objective: Vital signs in last 24 hours: Temp:  [97.5 F (36.4 C)-98.7 F (37.1 C)] 98.6 F (37 C) (01/09 0443) Pulse Rate:  [58-89] 63 (01/09 0443) Resp:  [11-20] 18 (01/09 0443) BP: (84-134)/(55-97) 112/79 mmHg (01/09 0443) SpO2:  [93 %-100 %] 95 % (01/09 0443)  Intake/Output from previous day:  Intake/Output Summary (Last 24 hours) at 08/06/15 0730 Last data filed at 08/06/15 [id number]  Gross per 24 hour  Intake 2233.75 ml  Output   1750 ml  Net 483.75 ml    Intake/Output this shift:    Labs:  Recent Labs  08/04/15 1935 08/05/15 1756  HGB 15.1 14.1    Recent Labs  08/04/15 1935 08/05/15 1756  WBC 18.5* 9.9  RBC 5.29 4.91  HCT 45.6 42.9  PLT 344 300    Recent Labs  08/04/15 1935 08/05/15 1756  NA 133*  --   K 3.9  --   CL 99*  --   CO2 25  --   BUN 8  --   CREATININE 1.02 1.22  GLUCOSE 106*  --   CALCIUM 9.2  --     Recent Labs  08/04/15 1935  INR 1.00     EXAM General - Patient is Alert and Oriented Extremity - Sensation intact distally Intact pulses distally Dressing/Incision - tender, blood tinged drainage. A new ABD reinforcement was given to the wrapping. No dressings were taken down. Motor Function - intact, moving foot and toes well on exam. The patient was able do a straight leg raise.  Past Medical History  Diagnosis Date  . Hyperlipidemia   . Hypertension   . Allergy     allergic rhinitis  . External hemorrhoid     Assessment/Plan: 1 Day Post-Op Procedure(s) (LRB): OPEN REDUCTION INTERNAL  FIXATION (ORIF) ANKLE FRACTURE (Right) Active Problems:   Trimalleolar fracture  Estimated body mass index is 26.78 kg/(m^2) as calculated from the following:   Height as of this encounter: 5\' 7"  (1.702 m).   Weight as of this encounter: 77.565 kg (171 lb). Advance diet Up with therapy Plan for discharge tomorrow. The discharge instructions will be done for when the patient is ready to go home. Possible today.  DVT Prophylaxis - Lovenox Toe-touch weight-bearing to right leg  Dedra Skeensodd Romi Rathel, PA-C Orthopaedic Surgery 08/06/2015, 7:30 AM

## 2015-08-06 NOTE — Progress Notes (Signed)
Pt with increased pain  In right ankle . Keeping ankle elevated on 3 pillows. Foot of bed elevated. rn spoke with todd mundy ,pa who ordered  toradol 30mg  iv once and use tramadol 50-100mg  po q4hr prn alternating with oxycodone-

## 2015-08-06 NOTE — Evaluation (Signed)
Physical Therapy Evaluation Patient Details Name: Robert Dickson MRN: [id number] DOB: 30-Aug-1980 Today's Date: 08/06/2015   History of Present Illness  Patient is a 35 y.o. male presents with right ankle pain. Onset of symptoms was abrupt starting 3 hours ago with unchanged course since that time. The pain is located all around the ankle. Patient describes the pain as sharp continuous and rated as moderate. 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  Previous studies include x-ray showing trimalleolar ankle fracture CT showing comminuted fibula fracture with a small posterior rim fracture of the tibia. Pt underwent R ankle ORIF and is POD#1 at time of evaluation. Multiple evaluation attempts required due to need for improved pain management. Pt denies other falls in the last 12 months. Was previously fully independent community ambulatory without assistive device. Pt drives and works.  Clinical Impression  Pt reports pain is under control during evaluation. It takes multiple attempts as pain has increased today compared to yesterday. Pt is able to ambulate and ascend/descend stairs safely with use of crutches. He requires increased practice as he is slightly unsteady initially upon standing. Pt provided extensive education regarding use of crutches as well as safety with transfers and stairs. Pt is safe to discharge home whenever medically stable. He will need OP PT per orthopedic surgeon recommendations. Pt will benefit from skilled PT services to address deficits in strength, balance, and mobility in order to return to full function at home.     Follow Up Recommendations Outpatient PT (When indicated per orthopedist)    Equipment Recommendations  Crutches;Other (comment)  (shower chair)    Recommendations for Other Services       Precautions / Restrictions Precautions Precautions: Fall Precaution Comments: R ankle immobilized in posterior splint Restrictions Weight Bearing Restrictions: Yes RLE Weight Bearing: Touchdown weight bearing      Mobility  Bed Mobility Overal bed mobility: Independent             General bed mobility comments: No need for bed rails or elevated head of bed  Transfers Overall transfer level: Needs assistance Equipment used: Crutches Transfers: Sit to/from Stand Sit to Stand: Min guard         General transfer comment: Pt requires instruction for proper use of axillary crutches and placement during sit to stand transfer. Pt initially unstable with transfer but improves with repetition. Upon first standing pt reports dizziness but this subsides quickly  Ambulation/Gait Ambulation/Gait assistance: Min guard;Min assist Ambulation Distance (Feet): 120 Feet Assistive device: Crutches     Gait velocity interpretation: Below normal speed for age/gender General Gait Details: Pt with hop-to gait pattern with axillary crutches maintaing TTWB on RLE. Pt requires education and instruction for proper sequencing with crutches. He is initally mildly unstable requiring a couple episodes of minA+1 assist due to LOB but eventually improves balance as well as speed and step length on LLE.  Stairs Stairs: Yes Stairs assistance: Min assist Stair Management: No rails;With crutches Number of Stairs: 4 (Performed twice for practice) General stair comments: Practiced ascend/descend 4 stairs twice with patient. Pt initially with some instability but improves with repetition. Education regarding proper sequencing with crutches. Pt has more difficulty with descent. Pt also educated about how to perform stairs with unilateral  hand rail as well as going backwards in sitting. Pt reports feeling comfortable completing the stairs upon  returning home  Wheelchair Mobility    Modified Rankin (Stroke Patients Only)       Balance Overall balance assessment: Needs assistance Sitting-balance support: No upper extremity supported Sitting balance-Leahy Scale: Normal       Standing balance-Leahy Scale: Fair Standing balance comment: Pt initially somewhat unsteady in standing but improves with extended duration                             Pertinent Vitals/Pain Pain Assessment: 0-10 Pain Score: 6  Pain Location: R ankle/foot Pain Descriptors / Indicators: Aching Pain Intervention(s): Monitored during session;Premedicated before session    Home Living Family/patient expects to be discharged to:: Private residence Living Arrangements: Children Available Help at Discharge: Family (Children there 50% of the time) Type of Home: House Home Access: Stairs to enter Entrance Stairs-Rails: Doctor, general practice of Steps: 5 Home Layout: One level Home Equipment: None      Prior Function Level of Independence: Independent         Comments: Fully independent community ambulator without assistive device, drives     Hand Dominance   Dominant Hand: Right    Extremity/Trunk Assessment   Upper Extremity Assessment: Overall WFL for tasks assessed           Lower Extremity Assessment: RLE deficits/detail RLE Deficits / Details: R ankle immobilized s/p ORIF. Otherwise bilateral LE strength WFL. Pt denies numbness/tingling in RLE. Able to flex/extend toes of R foot       Communication   Communication: No difficulties  Cognition Arousal/Alertness: Awake/alert Behavior During Therapy: WFL for tasks assessed/performed Overall Cognitive Status: Within Functional Limits for tasks assessed                      General Comments      Exercises        Assessment/Plan    PT Assessment Patient needs continued PT services  PT Diagnosis Difficulty walking;Abnormality of  gait;Generalized weakness;Acute pain   PT Problem List Decreased strength;Decreased range of motion;Decreased balance;Decreased mobility;Pain;Decreased activity tolerance  PT Treatment Interventions DME instruction;Gait training;Stair training;Therapeutic activities;Therapeutic exercise;Balance training;Neuromuscular re-education;Patient/family education   PT Goals (Current goals can be found in the Care Plan section) Acute Rehab PT Goals Patient Stated Goal: "I want to get back to my normal self" PT Goal Formulation: With patient Time For Goal Achievement: 08/20/15 Potential to Achieve Goals: Good    Frequency BID   Barriers to discharge Inaccessible home environment Stairs to enter home    Co-evaluation               End of Session Equipment Utilized During Treatment: Gait belt Activity Tolerance: Patient tolerated treatment well Patient left: in bed;with call bell/phone within reach;with bed alarm set;Other (comment) (foot elevated on pillow, ice packs on RLE) Nurse Communication: Mobility status         Time: 1100-1130 PT Time Calculation (min) (ACUTE ONLY): 30 min   Charges:   PT Evaluation $PT Eval Low Complexity: 1 Procedure PT Treatments $Gait Training: 8-22 mins   PT G Codes:       Sharalyn Ink Iqra Rotundo PT, DPT   Cletis Muma 08/06/2015, 12:01 PM

## 2015-08-06 NOTE — Care Management Note (Addendum)
Case Management Note  Patient Details  Name: Robert Dickson MRN: [id number] Date of Birth: 12-Oct-1980  Subjective/Objective:                  Met with patient to discuss discharge planning. His PCP is Dr. Glori Bickers with [facility]. He lives with his brother and brother's wife. He has not DME as he was independent prior to this injury. Patient has discharge in for today. PT pending- pain delay. He uses [facility] [address] [address] for Rx.   Action/Plan: RNCM will continue to follow.   Expected Discharge Date:  08/05/15               Expected Discharge Plan:     In-House Referral:     Discharge planning Services  CM Consult  Post Acute Care Choice:  Durable Medical Equipment, Home Health Choice offered to:  Patient  DME Arranged:    DME Agency:     HH Arranged:    [place] Agency:     Status of Service:  In process, will continue to follow  Medicare Important Message Given:    Date Medicare IM Given:    Medicare IM give by:    Date Additional Medicare IM Given:    Additional Medicare Important Message give by:     If discussed at [place] of Stay Meetings, dates discussed:    Additional Comments: PT is recommending shower chair and crutches. Referral sent to Will with [facility]. I have requested Rx from Dr. Rudene Christians. \ Marshell Garfinkel, RN 08/06/2015, 11:24 AM

## 2015-08-16 ENCOUNTER — Other Ambulatory Visit: Payer: PRIVATE HEALTH INSURANCE

## 2015-08-20 ENCOUNTER — Ambulatory Visit: Payer: PRIVATE HEALTH INSURANCE | Admitting: Family Medicine

## 2015-11-29 ENCOUNTER — Other Ambulatory Visit (INDEPENDENT_AMBULATORY_CARE_PROVIDER_SITE_OTHER): Payer: PRIVATE HEALTH INSURANCE

## 2015-11-29 DIAGNOSIS — E785 Hyperlipidemia, unspecified: Secondary | ICD-10-CM | POA: Diagnosis not present

## 2015-11-29 DIAGNOSIS — I10 Essential (primary) hypertension: Secondary | ICD-10-CM | POA: Diagnosis not present

## 2015-11-29 LAB — COMPREHENSIVE METABOLIC PANEL
ALBUMIN: 4.7 g/dL (ref 3.5–5.2)
ALK PHOS: 105 U/L (ref 39–117)
ALT: 32 U/L (ref 0–53)
AST: 25 U/L (ref 0–37)
BILIRUBIN TOTAL: 0.5 mg/dL (ref 0.2–1.2)
BUN: 8 mg/dL (ref 6–23)
CALCIUM: 10 mg/dL (ref 8.4–10.5)
CO2: 28 mEq/L (ref 19–32)
Chloride: 100 mEq/L (ref 96–112)
Creatinine, Ser: 1.13 mg/dL (ref 0.40–1.50)
GFR: 77.81 mL/min (ref >60.00)
Glucose, Bld: 103 mg/dL — ABNORMAL HIGH (ref 70–99)
Potassium: 4.3 mEq/L (ref 3.5–5.1)
Sodium: 136 mEq/L (ref 135–145)
TOTAL PROTEIN: 7.9 g/dL (ref 6.0–8.3)

## 2015-11-29 LAB — LIPID PANEL
Cholesterol: 222 mg/dL — ABNORMAL HIGH (ref 0–200)
HDL: 78.5 mg/dL (ref >39.00)
NonHDL: 143.96
TRIGLYCERIDES: 309 mg/dL — AB (ref 0.0–149.0)
Total CHOL/HDL Ratio: 3
VLDL: 61.8 mg/dL — ABNORMAL HIGH (ref 0.0–40.0)

## 2015-11-29 LAB — LDL CHOLESTEROL, DIRECT: LDL DIRECT: 115 mg/dL

## 2015-12-04 ENCOUNTER — Ambulatory Visit (INDEPENDENT_AMBULATORY_CARE_PROVIDER_SITE_OTHER): Payer: PRIVATE HEALTH INSURANCE | Admitting: Family Medicine

## 2015-12-04 ENCOUNTER — Encounter: Payer: Self-pay | Admitting: Family Medicine

## 2015-12-04 VITALS — BP 138/85 | HR 81 | Temp 98.0°F | Ht 67.25 in | Wt 168.5 lb

## 2015-12-04 DIAGNOSIS — Z0000 Encounter for general adult medical examination without abnormal findings: Secondary | ICD-10-CM | POA: Diagnosis not present

## 2015-12-04 DIAGNOSIS — I10 Essential (primary) hypertension: Secondary | ICD-10-CM

## 2015-12-04 DIAGNOSIS — E785 Hyperlipidemia, unspecified: Secondary | ICD-10-CM | POA: Diagnosis not present

## 2015-12-04 NOTE — Patient Instructions (Signed)
I'm glad you are doing better with your ankle  Blood pressure is better on 2nd check at 138/85  Work on healthy diet ( and exercise when you are released to do so) Be mindful of cholesterol (fat) in diet   Avoid red meat/ fried foods/ egg yolks/ fatty breakfast meats/ butter, cheese and high fat dairy/ and shellfish   Also sugar / sweets Also as usual- salt / processed foods  Hope to get back to normal soon   Follow up in 6 months   Check bp at home  -if it stays above 140 on top or 90 on the bottom let me know

## 2015-12-04 NOTE — Progress Notes (Signed)
Pre visit review using our clinic review tool, if applicable. No additional management support is needed unless otherwise documented below in the visit note. 

## 2015-12-04 NOTE — Assessment & Plan Note (Signed)
Reviewed health habits including diet and exercise and skin cancer prevention Reviewed appropriate screening tests for age  Also reviewed health mt list, fam hx and immunization status , as well as social and family history   See HPI Labs reviewed I'm glad you are doing better with your ankle  Blood pressure is better on 2nd check at 138/85  Work on healthy diet ( and exercise when you are released to do so) Be mindful of cholesterol (fat) in diet   Avoid red meat/ fried foods/ egg yolks/ fatty breakfast meats/ butter, cheese and high fat dairy/ and shellfish   Also sugar / sweets Also as usual- salt / processed foods  Hope to get back to normal soon

## 2015-12-04 NOTE — Assessment & Plan Note (Addendum)
bp in fair control at this time  BP Readings from Last 1 Encounters:  12/04/15 138/85   No changes needed Disc lifstyle change with low sodium diet and exercise  Will watch at home also  Expect improvement with ability to exercise when cleared from ankle surgery

## 2015-12-04 NOTE — Assessment & Plan Note (Signed)
Disc goals for lipids and reasons to control them Rev labs with pt Rev low sat fat diet in detail Continue simvastatin and diet Disc dec red meat in diet

## 2015-12-04 NOTE — Progress Notes (Signed)
Subjective:    Patient ID: Robert Dickson, male    DOB: 1980-12-03, 35 y.o.   MRN: [id number]  HPI  Here for health maintenance exam and to review chronic medical problems    Had ankle fx with surgery in January Is going back Friday to be released fully  Hopes to be released for exercise -not able to do anything for a while   Wt is down 3lb with bmi of 26 Gained and then lost - was not able to be active   HIV screening -not high risk so declines   Flu shot- does not think he had one this past season   Will have to have PPD and some shots for work coming up   Td 5/12  bp is up on first check today - then improved on re check 138/85  No cp or palpitations or headaches or edema  No side effects to medicines  BP Readings from Last 3 Encounters:  12/04/15 154/98  08/06/15 121/82  07/20/15 135/90     He takes amlodipine 10 mg -took med 15 min before he got here  Has been well controlled  Trying to avoid processed foods (but people were bringing a lot of food)   Hyperlipidemia Lab Results  Component Value Date   CHOL 222* 11/29/2015   CHOL 213* 06/27/2014   CHOL 209* 07/20/2013   Lab Results  Component Value Date   HDL 78.50 11/29/2015   HDL 82.00 06/27/2014   HDL 85.30 07/20/2013   Lab Results  Component Value Date   LDLCALC 112* 06/27/2014   LDLCALC 107* 12/02/2010   LDLCALC 93 04/02/2010   Lab Results  Component Value Date   TRIG 309.0* 11/29/2015   TRIG 94.0 06/27/2014   TRIG 193.0* 07/20/2013   Lab Results  Component Value Date   CHOLHDL 3 11/29/2015   CHOLHDL 3 06/27/2014   CHOLHDL 2 07/20/2013   Lab Results  Component Value Date   LDLDIRECT 115.0 11/29/2015   LDLDIRECT 102.5 07/20/2013   LDLDIRECT 139.5 06/21/2012   had eaten greasy food the night before (hamburger and steak and fries)-unusual for him  Has been eating more sweets also  Fasting blood sugar 103  On simvastatin and diet     Chemistry      Component Value Date/Time   NA 136  11/29/2015 0803   K 4.3 11/29/2015 0803   CL 100 11/29/2015 0803   CO2 28 11/29/2015 0803   BUN 8 11/29/2015 0803   CREATININE 1.13 11/29/2015 0803      Component Value Date/Time   CALCIUM 10.0 11/29/2015 0803   ALKPHOS 105 11/29/2015 0803   AST 25 11/29/2015 0803   ALT 32 11/29/2015 0803   BILITOT 0.5 11/29/2015 0803       Patient Active Problem List   Diagnosis Date Noted  . Trimalleolar fracture 08/04/2015  . Globus sensation 06/27/2014  . Plantar wart, left foot 07/20/2013  . Routine general medical examination at a health care facility 12/09/2010  . ALLERGIC RHINITIS 04/05/2010  . Hyperlipidemia 11/11/2006  . Essential hypertension 11/11/2006  . MURMUR 11/11/2006   Past Medical History  Diagnosis Date  . Hyperlipidemia   . Hypertension   . Allergy     allergic rhinitis  . External hemorrhoid    Past Surgical History  Procedure Laterality Date  . Orif ankle fracture Right 08/05/2015    Procedure: OPEN REDUCTION INTERNAL FIXATION (ORIF) ANKLE FRACTURE;  Surgeon: Kennedy Bucker, MD;  Location: [facility] ORS;  Service: Orthopedics;  Laterality: Right;   Social History  Substance Use Topics  . Smoking status: Never Smoker   . Smokeless tobacco: Former NeurosurgeonUser  . Alcohol Use: 0.0 oz/week    0 Standard drinks or equivalent per week     Comment: 1-2 drinks/day(a littlem ore in summer)   Family History  Problem Relation Age of Onset  . Hypertension Father    Allergies  Allergen Reactions  . Lisinopril Cough    ? cough   Current Outpatient Prescriptions on File Prior to Visit  Medication Sig Dispense Refill  . amLODipine (NORVASC) 10 MG tablet Take 1 tablet (10 mg total) by mouth daily. 30 tablet 11  . Multiple Vitamin (MULTIVITAMIN) capsule Take 1 capsule by mouth daily.      . simvastatin (ZOCOR) 20 MG tablet TAKE ONE TABLET BY MOUTH AT BEDTIME 30 tablet 10  . albuterol (PROVENTIL HFA;VENTOLIN HFA) 108 (90 BASE) MCG/ACT inhaler Inhale 2 puffs into the lungs every 4  (four) hours as needed for wheezing. (Patient not taking: Reported on 12/04/2015) 1 Inhaler 3   No current facility-administered medications on file prior to visit.    Review of Systems Review of Systems  Constitutional: Negative for fever, appetite change, fatigue and unexpected weight change.  Eyes: Negative for pain and visual disturbance.  Respiratory: Negative for cough and shortness of breath.   Cardiovascular: Negative for cp or palpitations    Gastrointestinal: Negative for nausea, diarrhea and constipation.  Genitourinary: Negative for urgency and frequency.  Skin: Negative for pallor or rash   MSK pos for ankle swelling s/p surgery that is improving  Neurological: Negative for weakness, light-headedness, numbness and headaches.  Hematological: Negative for adenopathy. Does not bruise/bleed easily.  Psychiatric/Behavioral: Negative for dysphoric mood. The patient is not nervous/anxious.         Objective:   Physical Exam  Constitutional: He appears well-developed and well-nourished. No distress.  Well appearing  HENT:  Head: Normocephalic and atraumatic.  Right Ear: External ear normal.  Left Ear: External ear normal.  Nose: Nose normal.  Mouth/Throat: Oropharynx is clear and moist.  Eyes: Conjunctivae and EOM are normal. Pupils are equal, round, and reactive to light. Right eye exhibits no discharge. Left eye exhibits no discharge. No scleral icterus.  Neck: Normal range of motion. Neck supple. No JVD present. Carotid bruit is not present. No thyromegaly present.  Cardiovascular: Normal rate, regular rhythm, normal heart sounds and intact distal pulses.  Exam reveals no gallop.   Pulmonary/Chest: Effort normal and breath sounds normal. No respiratory distress. He has no wheezes. He exhibits no tenderness.  Abdominal: Soft. Bowel sounds are normal. He exhibits no distension, no abdominal bruit and no mass. There is no tenderness.  Musculoskeletal: He exhibits no edema or  tenderness.  Post op changes L ankle with fair rom  Lymphadenopathy:    He has no cervical adenopathy.  Neurological: He is alert. He has normal reflexes. No cranial nerve deficit. He exhibits normal muscle tone. Coordination normal.  Skin: Skin is warm and dry. No rash noted. No erythema. No pallor.  Solar lentigines with some brown nevi on trunk  Psychiatric: He has a normal mood and affect.          Assessment & Plan:   Problem List Items Addressed This Visit      Cardiovascular and Mediastinum   Essential hypertension - Primary    bp in fair control at this time  BP Readings from Last 1 Encounters:  12/04/15 138/85   No changes needed Disc lifstyle change with low sodium diet and exercise  Will watch at home also  Expect improvement with ability to exercise when cleared from ankle surgery         Other   Hyperlipidemia    Disc goals for lipids and reasons to control them Rev labs with pt Rev low sat fat diet in detail Continue simvastatin and diet Disc dec red meat in diet       Routine general medical examination at a health care facility    Reviewed health habits including diet and exercise and skin cancer prevention Reviewed appropriate screening tests for age  Also reviewed health mt list, fam hx and immunization status , as well as social and family history   See HPI Labs reviewed I'm glad you are doing better with your ankle  Blood pressure is better on 2nd check at 138/85  Work on healthy diet ( and exercise when you are released to do so) Be mindful of cholesterol (fat) in diet   Avoid red meat/ fried foods/ egg yolks/ fatty breakfast meats/ butter, cheese and high fat dairy/ and shellfish   Also sugar / sweets Also as usual- salt / processed foods  Hope to get back to normal soon

## 2016-05-22 ENCOUNTER — Other Ambulatory Visit: Payer: Self-pay | Admitting: Family Medicine

## 2016-06-06 ENCOUNTER — Ambulatory Visit: Payer: PRIVATE HEALTH INSURANCE | Admitting: Family Medicine

## 2016-06-10 ENCOUNTER — Ambulatory Visit (INDEPENDENT_AMBULATORY_CARE_PROVIDER_SITE_OTHER): Payer: No Typology Code available for payment source | Admitting: Family Medicine

## 2016-06-10 ENCOUNTER — Encounter: Payer: Self-pay | Admitting: Family Medicine

## 2016-06-10 VITALS — BP 135/85 | HR 87 | Temp 97.6°F | Ht 67.25 in | Wt 178.8 lb

## 2016-06-10 DIAGNOSIS — I10 Essential (primary) hypertension: Secondary | ICD-10-CM | POA: Diagnosis not present

## 2016-06-10 DIAGNOSIS — J069 Acute upper respiratory infection, unspecified: Secondary | ICD-10-CM | POA: Insufficient documentation

## 2016-06-10 DIAGNOSIS — E7800 Pure hypercholesterolemia, unspecified: Secondary | ICD-10-CM

## 2016-06-10 DIAGNOSIS — B9789 Other viral agents as the cause of diseases classified elsewhere: Secondary | ICD-10-CM

## 2016-06-10 MED ORDER — ALBUTEROL SULFATE HFA 108 (90 BASE) MCG/ACT IN AERS: 2.0000 | INHALATION_SPRAY | RESPIRATORY_TRACT | 11 refills | Status: DC | PRN

## 2016-06-10 MED ORDER — SIMVASTATIN 20 MG PO TABS: 20.0000 mg | ORAL_TABLET | Freq: Every day | ORAL | 11 refills | Status: DC

## 2016-06-10 MED ORDER — AMLODIPINE BESYLATE 10 MG PO TABS: 10.0000 mg | ORAL_TABLET | Freq: Every day | ORAL | 11 refills | Status: DC

## 2016-06-10 NOTE — Progress Notes (Signed)
Subjective:    Patient ID: Robert Dickson, male    DOB: [birth year], 35 y.o.   MRN: [id number]  HPI Here for f/u of chronic medical problems   Doing ok overall but thinks he is coming down with a cold Sore throat and some cough this am Congestion  (his son was sick last wkend)  No otc meds yet   Ankle still bothers him occ (had fracture and surgery) Toes tingle  Tried to run -could not quite yet   Wt Readings from Last 3 Encounters:  06/10/16 178 lb 12 oz (81.1 kg)  12/04/15 168 lb 8 oz (76.4 kg)  08/04/15 171 lb (77.6 kg)   bmi of 27.7 overwt   bp is stable today   (better on re check 135/85) No cp or palpitations or headaches or edema  No side effects to medicines  BP Readings from Last 3 Encounters:  06/10/16 (!) 148/98  12/04/15 138/85  08/06/15 121/82    On amlodipine 10 mg  He has not taken his medicine yet  He has a cuff at home - checks occ and it is usually 130s systolic   Diet- is fair - no junk food as a rule  Does not add salt  Some processed foods   Exercise -he has been going back to the gym - just started 2 wk ago  Hard to get started  Wants to get his weight back down      Chemistry      Component Value Date/Time   NA 136 11/29/2015 0803   K 4.3 11/29/2015 0803   CL 100 11/29/2015 0803   CO2 28 11/29/2015 0803   BUN 8 11/29/2015 0803   CREATININE 1.13 11/29/2015 0803      Component Value Date/Time   CALCIUM 10.0 11/29/2015 0803   ALKPHOS 105 11/29/2015 0803   AST 25 11/29/2015 0803   ALT 32 11/29/2015 0803   BILITOT 0.5 11/29/2015 0803       Hx of hyperlipidemia Lab Results  Component Value Date   CHOL 222 (H) 11/29/2015   HDL 78.50 11/29/2015   LDLCALC 112 (H) 06/27/2014   LDLDIRECT 115.0 11/29/2015   TRIG 309.0 (H) 11/29/2015   CHOLHDL 3 11/29/2015   Simvastatin and diet   Patient Active Problem List   Diagnosis Date Noted  . Viral URI 06/10/2016  . Trimalleolar fracture 08/04/2015  . Globus sensation 06/27/2014  .  Plantar wart, left foot 07/20/2013  . Routine general medical examination at a health care facility 12/09/2010  . ALLERGIC RHINITIS 04/05/2010  . Hyperlipidemia 11/11/2006  . Essential hypertension 11/11/2006  . MURMUR 11/11/2006   Past Medical History:  Diagnosis Date  . Allergy    allergic rhinitis  . External hemorrhoid   . Hyperlipidemia   . Hypertension    Past Surgical History:  Procedure Laterality Date  . ORIF ANKLE FRACTURE Right 08/05/2015   Procedure: OPEN REDUCTION INTERNAL FIXATION (ORIF) ANKLE FRACTURE;  Surgeon: Kennedy BuckerMichael Menz, MD;  Location: [facility] ORS;  Service: Orthopedics;  Laterality: Right;   Social History  Substance Use Topics  . Smoking status: Never Smoker  . Smokeless tobacco: Former NeurosurgeonUser  . Alcohol use 0.0 oz/week     Comment: 1-2 drinks/day(a littlem ore in summer)   Family History  Problem Relation Age of Onset  . Hypertension Father    Allergies  Allergen Reactions  . Lisinopril Cough    ? cough   Current Outpatient Prescriptions on File Prior to Visit  Medication Sig Dispense Refill  . Multiple Vitamin (MULTIVITAMIN) capsule Take 1 capsule by mouth daily.       No current facility-administered medications on file prior to visit.     Review of Systems  Constitutional: Positive for appetite change and fatigue. Negative for fever.  HENT: Positive for congestion, postnasal drip, rhinorrhea, sinus pressure, sneezing and sore throat. Negative for ear pain.   Eyes: Negative for pain and discharge.  Respiratory: Positive for cough. Negative for shortness of breath, wheezing and stridor.   Cardiovascular: Negative for chest pain.  Gastrointestinal: Negative for diarrhea, nausea and vomiting.  Genitourinary: Negative for frequency, hematuria and urgency.  Musculoskeletal: Negative for arthralgias and myalgias.  Skin: Negative for rash.  Neurological: Positive for headaches. Negative for dizziness, weakness and light-headedness.    Psychiatric/Behavioral: Negative for confusion and dysphoric mood.       Objective:   Physical Exam  Constitutional: He appears well-developed and well-nourished. No distress.  Mildly overwt and well appearing   HENT:  Head: Normocephalic and atraumatic.  Right Ear: External ear normal.  Left Ear: External ear normal.  Mouth/Throat: Oropharynx is clear and moist.  Nares are injected and congested  No sinus tenderness Clear rhinorrhea and post nasal drip   Eyes: Conjunctivae and EOM are normal. Pupils are equal, round, and reactive to light. Right eye exhibits no discharge. Left eye exhibits no discharge.  Neck: Normal range of motion. Neck supple.  Cardiovascular: Normal rate.   Murmur heard. Pulmonary/Chest: Effort normal and breath sounds normal. No respiratory distress. He has no wheezes. He has no rales. He exhibits no tenderness.  Lymphadenopathy:    He has no cervical adenopathy.  Neurological: He is alert.  Skin: Skin is warm and dry. No rash noted.  Psychiatric: He has a normal mood and affect.          Assessment & Plan:   Problem List Items Addressed This Visit      Cardiovascular and Mediastinum   Essential hypertension - Primary    bp in fair control at this time (lower at home-pt forgot his medicine today) BP Readings from Last 1 Encounters:  06/10/16 135/85   No changes needed Disc lifstyle change with low sodium diet and exercise        Relevant Medications   simvastatin (ZOCOR) 20 MG tablet   amLODipine (NORVASC) 10 MG tablet     Respiratory   Viral URI    Re assuring exam Early in course rec zinc losenges Disc symptomatic care - see instructions on AVS  Update if not starting to improve in a week or if worsening          Other   Hyperlipidemia    Disc goals for lipids and reasons to control them Rev labs with pt Rev low sat fat diet in detail Stable on simvastatin in May Re check in 6 mo      Relevant Medications   simvastatin  (ZOCOR) 20 MG tablet   amLODipine (NORVASC) 10 MG tablet

## 2016-06-10 NOTE — Patient Instructions (Signed)
I think you have an early cold (viral uri) Drink fluids/breathe steam for congestion  Nasal saline spray is helpful congestion  Tylenol or ibuprofen for fever and body /head aches  mucinex or robitussin DM for cough  Zyrtec for runny nose  Afrin nasal spray - is ok for 2 days only for congestion   ny quil is ok for pm- as long as you have not recently taken these   losenges with zinc may help in the first few days also   Update if not starting to improve in a week or if worsening    Blood pressure was better on the 2nd check -take your medicine Watch it at home   Follow up in 6 months for physical

## 2016-06-10 NOTE — Progress Notes (Signed)
Pre visit review using our clinic review tool, if applicable. No additional management support is needed unless otherwise documented below in the visit note. 

## 2016-06-12 NOTE — Assessment & Plan Note (Signed)
bp in fair control at this time (lower at home-pt forgot his medicine today) BP Readings from Last 1 Encounters:  06/10/16 135/85   No changes needed Disc lifstyle change with low sodium diet and exercise

## 2016-06-12 NOTE — Assessment & Plan Note (Signed)
Disc goals for lipids and reasons to control them Rev labs with pt Rev low sat fat diet in detail Stable on simvastatin in May Re check in 6 mo

## 2016-06-12 NOTE — Assessment & Plan Note (Signed)
Re assuring exam Early in course rec zinc losenges Disc symptomatic care - see instructions on AVS  Update if not starting to improve in a week or if worsening

## 2016-07-20 ENCOUNTER — Other Ambulatory Visit: Payer: Self-pay | Admitting: Family Medicine

## 2016-11-29 ENCOUNTER — Telehealth: Payer: Self-pay | Admitting: Family Medicine

## 2016-11-29 DIAGNOSIS — Z0000 Encounter for general adult medical examination without abnormal findings: Secondary | ICD-10-CM

## 2016-11-29 NOTE — Telephone Encounter (Signed)
-----   Message from Natasha C Chavers sent at 11/24/2016  1:30 PM EDT ----- Regarding: Cpx labs Fri 5/11, need orders. Thanks! :-) Please order  future cpx labs for pt's upcoming lab appt. Thanks Tasha  

## 2016-12-05 ENCOUNTER — Other Ambulatory Visit (INDEPENDENT_AMBULATORY_CARE_PROVIDER_SITE_OTHER): Payer: No Typology Code available for payment source

## 2016-12-05 DIAGNOSIS — Z0000 Encounter for general adult medical examination without abnormal findings: Secondary | ICD-10-CM

## 2016-12-05 LAB — COMPREHENSIVE METABOLIC PANEL
ALT: 33 U/L (ref 0–53)
AST: 25 U/L (ref 0–37)
Albumin: 4.6 g/dL (ref 3.5–5.2)
Alkaline Phosphatase: 83 U/L (ref 39–117)
BILIRUBIN TOTAL: 0.4 mg/dL (ref 0.2–1.2)
BUN: 12 mg/dL (ref 6–23)
CALCIUM: 9.9 mg/dL (ref 8.4–10.5)
CHLORIDE: 100 meq/L (ref 96–112)
CO2: 28 meq/L (ref 19–32)
CREATININE: 1.22 mg/dL (ref 0.40–1.50)
GFR: 70.82 mL/min (ref >60.00)
GLUCOSE: 141 mg/dL — AB (ref 70–99)
Potassium: 4.3 mEq/L (ref 3.5–5.1)
SODIUM: 136 meq/L (ref 135–145)
Total Protein: 7.6 g/dL (ref 6.0–8.3)

## 2016-12-05 LAB — CBC WITH DIFFERENTIAL/PLATELET
BASOS ABS: 0 10*3/uL (ref 0.0–0.1)
Basophils Relative: 0.3 % (ref 0.0–3.0)
EOS ABS: 0.4 10*3/uL (ref 0.0–0.7)
Eosinophils Relative: 5.7 % — ABNORMAL HIGH (ref 0.0–5.0)
HEMATOCRIT: 46.3 % (ref 39.0–52.0)
Hemoglobin: 15.7 g/dL (ref 13.0–17.0)
LYMPHS PCT: 33.6 % (ref 12.0–46.0)
Lymphs Abs: 2.3 10*3/uL (ref 0.7–4.0)
MCHC: 33.9 g/dL (ref 30.0–36.0)
MCV: 85.6 fl (ref 78.0–100.0)
MONO ABS: 0.6 10*3/uL (ref 0.1–1.0)
Monocytes Relative: 9 % (ref 3.0–12.0)
NEUTROS ABS: 3.5 10*3/uL (ref 1.4–7.7)
Neutrophils Relative %: 51.4 % (ref 43.0–77.0)
PLATELETS: 350 10*3/uL (ref 150.0–400.0)
RBC: 5.41 Mil/uL (ref 4.22–5.81)
RDW: 13.2 % (ref 11.5–15.5)
WBC: 6.8 10*3/uL (ref 4.0–10.5)

## 2016-12-05 LAB — LIPID PANEL
CHOL/HDL RATIO: 3
Cholesterol: 187 mg/dL (ref 0–200)
HDL: 64.1 mg/dL (ref >39.00)
LDL CALC: 88 mg/dL (ref 0–99)
NONHDL: 122.71
TRIGLYCERIDES: 173 mg/dL — AB (ref 0.0–149.0)
VLDL: 34.6 mg/dL (ref 0.0–40.0)

## 2016-12-05 LAB — TSH: TSH: 2.05 u[IU]/mL (ref 0.35–4.50)

## 2016-12-10 ENCOUNTER — Ambulatory Visit (INDEPENDENT_AMBULATORY_CARE_PROVIDER_SITE_OTHER): Payer: No Typology Code available for payment source | Admitting: Family Medicine

## 2016-12-10 ENCOUNTER — Encounter: Payer: Self-pay | Admitting: Family Medicine

## 2016-12-10 VITALS — BP 138/82 | HR 78 | Temp 98.1°F | Ht 67.0 in | Wt 172.8 lb

## 2016-12-10 DIAGNOSIS — I10 Essential (primary) hypertension: Secondary | ICD-10-CM | POA: Diagnosis not present

## 2016-12-10 DIAGNOSIS — Z0000 Encounter for general adult medical examination without abnormal findings: Secondary | ICD-10-CM

## 2016-12-10 DIAGNOSIS — E7800 Pure hypercholesterolemia, unspecified: Secondary | ICD-10-CM

## 2016-12-10 MED ORDER — SIMVASTATIN 20 MG PO TABS: 20.0000 mg | ORAL_TABLET | Freq: Every day | ORAL | 3 refills | Status: DC

## 2016-12-10 MED ORDER — AMLODIPINE BESYLATE 10 MG PO TABS: 10.0000 mg | ORAL_TABLET | Freq: Every day | ORAL | 3 refills | Status: DC

## 2016-12-10 NOTE — Patient Instructions (Addendum)
Try to eat a healthy diet and exercise regularly  Eat less junk and sugar when possible  Eat at home when possible   Cholesterol is great  Blood sugar was elevated (due to ginger ale just before draw)-next time fast for your blood draw   If you become more fatigued let us know (try to get more sleep if possible)  If libido gets lower- also alert us , we can check a testosterone level

## 2016-12-10 NOTE — Progress Notes (Signed)
Subjective:    Patient ID: Robert Dickson, male    DOB: [birth year], 36 y.o.   MRN: [id number]  HPI Here for health maintenance exam and to review chronic medical problems    In general feeling tired -more than usual  Thinks he gets enough sleep  Working a lot    [employer] from Last 3 Encounters:  12/10/16 172 lb 12 oz (78.4 kg)  06/10/16 178 lb 12 oz (81.1 kg)  12/04/15 168 lb 8 oz (76.4 kg)  wt is down 6 lb  Good appetite  Eats too much "bad stuff" - like red meat  bmi 27.0  Flu shot -did not get   Tetanus shot 5/12  Blood glucose was elevated at draw on 5/11 at 141 Had just drank a ginger ale a few minutes before his visit    bp is stable today  No cp or palpitations or headaches or edema  No side effects to medicines  BP Readings from Last 3 Encounters:  12/10/16 138/82  06/10/16 135/85  12/04/15 138/85     On amlodipine 10 mg daily    Hx of hyperlipidemia Lab Results  Component Value Date   CHOL 187 12/05/2016   CHOL 222 (H) 11/29/2015   CHOL 213 (H) 06/27/2014   Lab Results  Component Value Date   HDL 64.10 12/05/2016   HDL 78.50 11/29/2015   HDL 82.00 06/27/2014   Lab Results  Component Value Date   LDLCALC 88 12/05/2016   LDLCALC 112 (H) 06/27/2014   LDLCALC 107 (H) 12/02/2010   Lab Results  Component Value Date   TRIG 173.0 (H) 12/05/2016   TRIG 309.0 (H) 11/29/2015   TRIG 94.0 06/27/2014   Lab Results  Component Value Date   CHOLHDL 3 12/05/2016   CHOLHDL 3 11/29/2015   CHOLHDL 3 06/27/2014   Lab Results  Component Value Date   LDLDIRECT 115.0 11/29/2015   LDLDIRECT 102.5 07/20/2013   LDLDIRECT 139.5 06/21/2012   On simvastatin and diet  No problems with it    Lab Results  Component Value Date   WBC 6.8 12/05/2016   HGB 15.7 12/05/2016   HCT 46.3 12/05/2016   MCV 85.6 12/05/2016   PLT 350.0 12/05/2016     Chemistry      Component Value Date/Time   NA 136 12/05/2016 0822   K 4.3 12/05/2016 0822   CL 100  12/05/2016 0822   CO2 28 12/05/2016 0822   BUN 12 12/05/2016 0822   CREATININE 1.22 12/05/2016 0822      Component Value Date/Time   CALCIUM 9.9 12/05/2016 0822   ALKPHOS 83 12/05/2016 0822   AST 25 12/05/2016 0822   ALT 33 12/05/2016 0822   BILITOT 0.4 12/05/2016 0822      Lab Results  Component Value Date   TSH 2.05 12/05/2016    He wondered about low testosterone due to    Patient Active Problem List   Diagnosis Date Noted  . Trimalleolar fracture 08/04/2015  . Plantar wart, left foot 07/20/2013  . Routine general medical examination at a health care facility 12/09/2010  . ALLERGIC RHINITIS 04/05/2010  . Hyperlipidemia 11/11/2006  . Essential hypertension 11/11/2006  . MURMUR 11/11/2006   Past Medical History:  Diagnosis Date  . Allergy    allergic rhinitis  . External hemorrhoid   . Hyperlipidemia   . Hypertension    Past Surgical History:  Procedure Laterality Date  . ORIF ANKLE FRACTURE Right 08/05/2015   Procedure:  OPEN REDUCTION INTERNAL FIXATION (ORIF) ANKLE FRACTURE;  Surgeon: Kennedy Bucker, MD;  Location: [facility] ORS;  Service: Orthopedics;  Laterality: Right;   Social History  Substance Use Topics  . Smoking status: Never Smoker  . Smokeless tobacco: Former Neurosurgeon  . Alcohol use 0.0 oz/week     Comment: 1-2 drinks/day(a littlem ore in summer)   Family History  Problem Relation Age of Onset  . Hypertension Father    Allergies  Allergen Reactions  . Lisinopril Cough    ? cough   Current Outpatient Prescriptions on File Prior to Visit  Medication Sig Dispense Refill  . albuterol (PROVENTIL HFA;VENTOLIN HFA) 108 (90 Base) MCG/ACT inhaler Inhale 2 puffs into the lungs every 4 (four) hours as needed for wheezing. 1 Inhaler 11  . Multiple Vitamin (MULTIVITAMIN) capsule Take 1 capsule by mouth daily.       No current facility-administered medications on file prior to visit.     Review of Systems    Review of Systems  Constitutional: Negative for fever,  appetite change, and unexpected weight change. pos for some fatigue / poss dec libido Eyes: Negative for pain and visual disturbance.  Respiratory: Negative for cough and shortness of breath.   Cardiovascular: Negative for cp or palpitations    Gastrointestinal: Negative for nausea, diarrhea and constipation.  Genitourinary: Negative for urgency and frequency.  Skin: Negative for pallor or rash   Neurological: Negative for weakness, light-headedness, numbness and headaches.  Hematological: Negative for adenopathy. Does not bruise/bleed easily.  Psychiatric/Behavioral: Negative for dysphoric mood. The patient is not nervous/anxious.      Objective:   Physical Exam  Constitutional: He appears well-developed and well-nourished. No distress.  Well appearing  HENT:  Head: Normocephalic and atraumatic.  Right Ear: External ear normal.  Left Ear: External ear normal.  Nose: Nose normal.  Mouth/Throat: Oropharynx is clear and moist.  Boggy nares  Eyes: Conjunctivae and EOM are normal. Pupils are equal, round, and reactive to light. Right eye exhibits no discharge. Left eye exhibits no discharge. No scleral icterus.  Neck: Normal range of motion. Neck supple. No JVD present. Carotid bruit is not present. No thyromegaly present.  Cardiovascular: Normal rate, regular rhythm and intact distal pulses.  Exam reveals no gallop.   Murmur heard. Pulmonary/Chest: Effort normal and breath sounds normal. No respiratory distress. He has no wheezes. He exhibits no tenderness.  Abdominal: Soft. Bowel sounds are normal. He exhibits no distension, no abdominal bruit and no mass. There is no tenderness.  Musculoskeletal: He exhibits no edema or tenderness.  Lymphadenopathy:    He has no cervical adenopathy.  Neurological: He is alert. He has normal reflexes. No cranial nerve deficit. He exhibits normal muscle tone. Coordination normal.  Skin: Skin is warm and dry. No rash noted. No erythema. No pallor.    Lentigines diffusley  Psychiatric: He has a normal mood and affect.          Assessment & Plan:   Problem List Items Addressed This Visit      Cardiovascular and Mediastinum   Essential hypertension - Primary    bp in fair control at this time  BP Readings from Last 1 Encounters:  12/10/16 138/82   No changes needed Disc lifstyle change with low sodium diet and exercise  Doing well with amlodipine  Labs rev      Relevant Medications   amLODipine (NORVASC) 10 MG tablet   simvastatin (ZOCOR) 20 MG tablet     Other  Hyperlipidemia    Disc goals for lipids and reasons to control them Rev labs with pt Rev low sat fat diet in detail Controlled with simvastatin and diet  Disc lower carb diet for triglycerides        Relevant Medications   amLODipine (NORVASC) 10 MG tablet   simvastatin (ZOCOR) 20 MG tablet   Routine general medical examination at a health care facility    Reviewed health habits including diet and exercise and skin cancer prevention Reviewed appropriate screening tests for age  Also reviewed health mt list, fam hx and immunization status , as well as social and family history   See HPI Labs reviewed  Glucose was elevated-non fasting-just after sugary drink  We will watch this closely in future and do next labs fasting  Disc reducing sugar/carbs/junk food

## 2016-12-11 NOTE — Assessment & Plan Note (Signed)
Reviewed health habits including diet and exercise and skin cancer prevention Reviewed appropriate screening tests for age  Also reviewed health mt list, fam hx and immunization status , as well as social and family history   See HPI Labs reviewed  Glucose was elevated-non fasting-just after sugary drink  We will watch this closely in future and do next labs fasting  Disc reducing sugar/carbs/junk food

## 2016-12-11 NOTE — Assessment & Plan Note (Signed)
Disc goals for lipids and reasons to control them Rev labs with pt Rev low sat fat diet in detail Controlled with simvastatin and diet  Disc lower carb diet for triglycerides

## 2016-12-11 NOTE — Assessment & Plan Note (Signed)
bp in fair control at this time  BP Readings from Last 1 Encounters:  12/10/16 138/82   No changes needed Disc lifstyle change with low sodium diet and exercise  Doing well with amlodipine  Labs rev

## 2017-06-29 IMAGING — CR DG ANKLE COMPLETE 3+V*R*
1 series · 3 of 3 positions shown · non-contrast
Comparison: None.

CLINICAL DATA: Right ankle pain status post fall.

EXAM:
RIGHT ANKLE - COMPLETE 3+ VIEW

[Series 1: ap · 0.17mm/px · 3 of 3 slices shown]
[im 1/3]
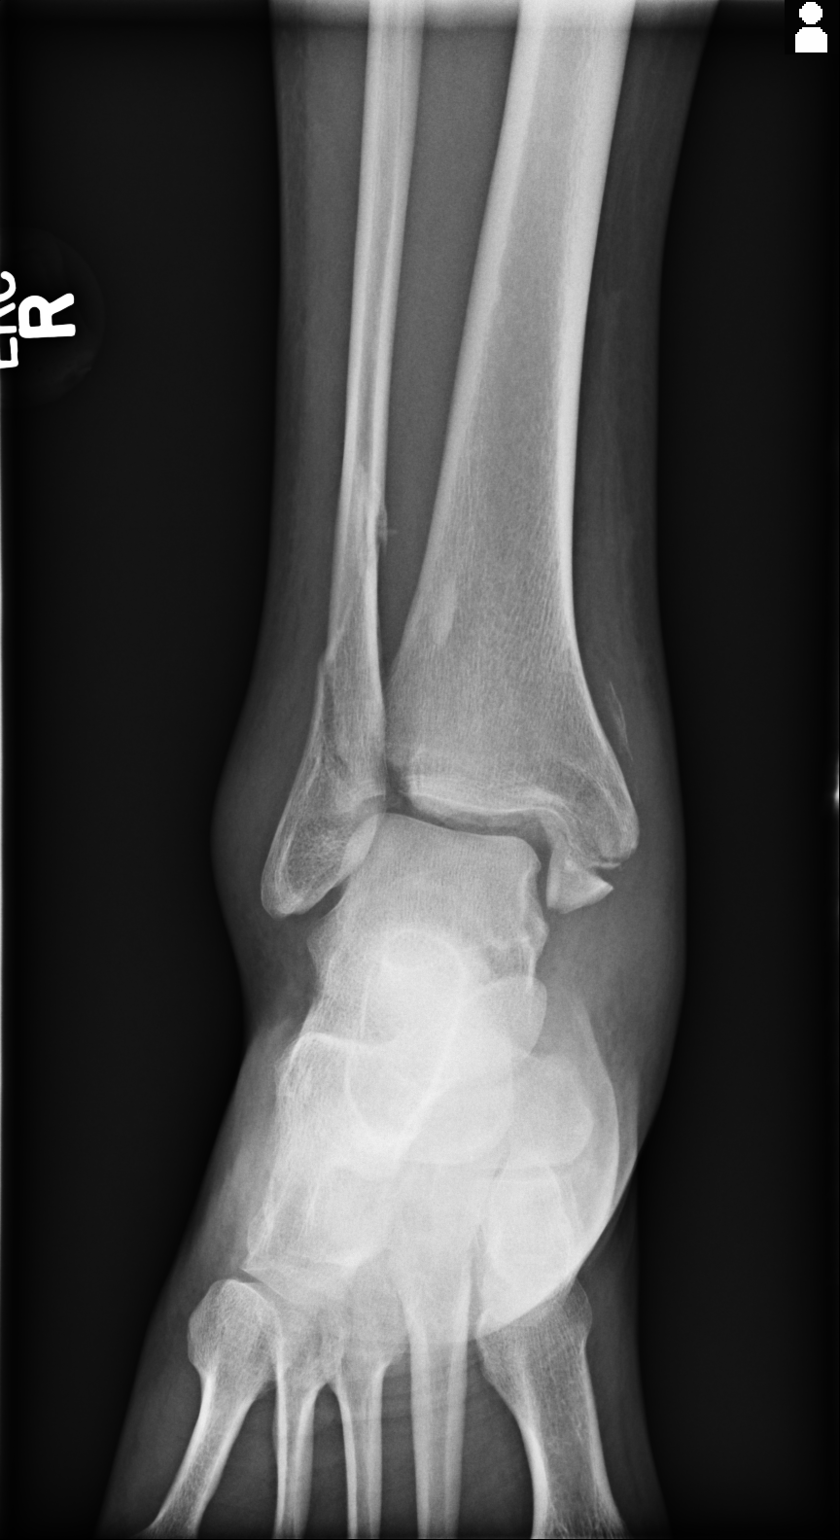
[im 2/3]
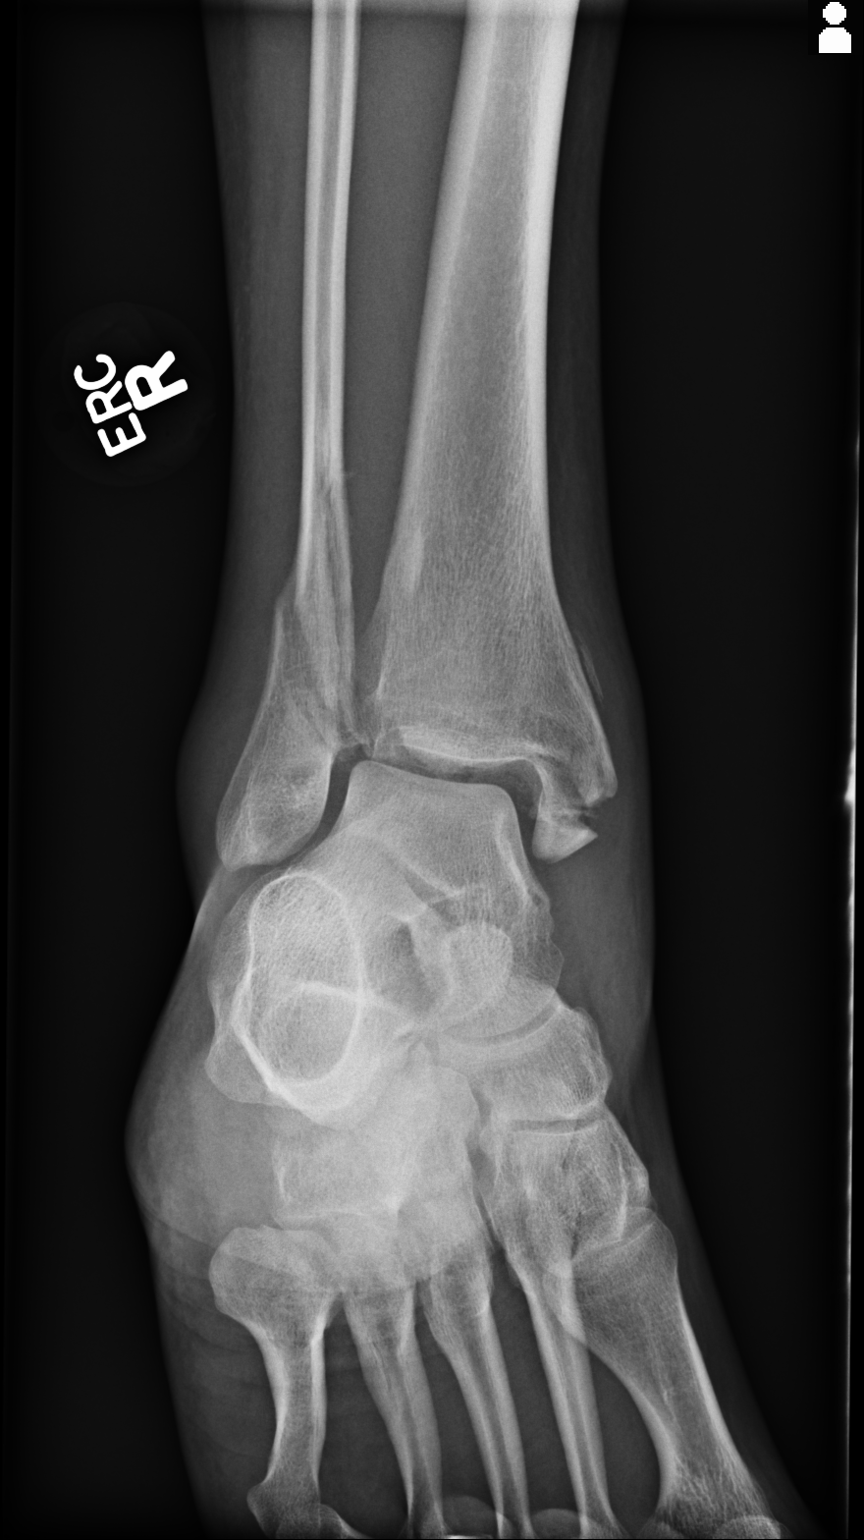
[im 3/3]
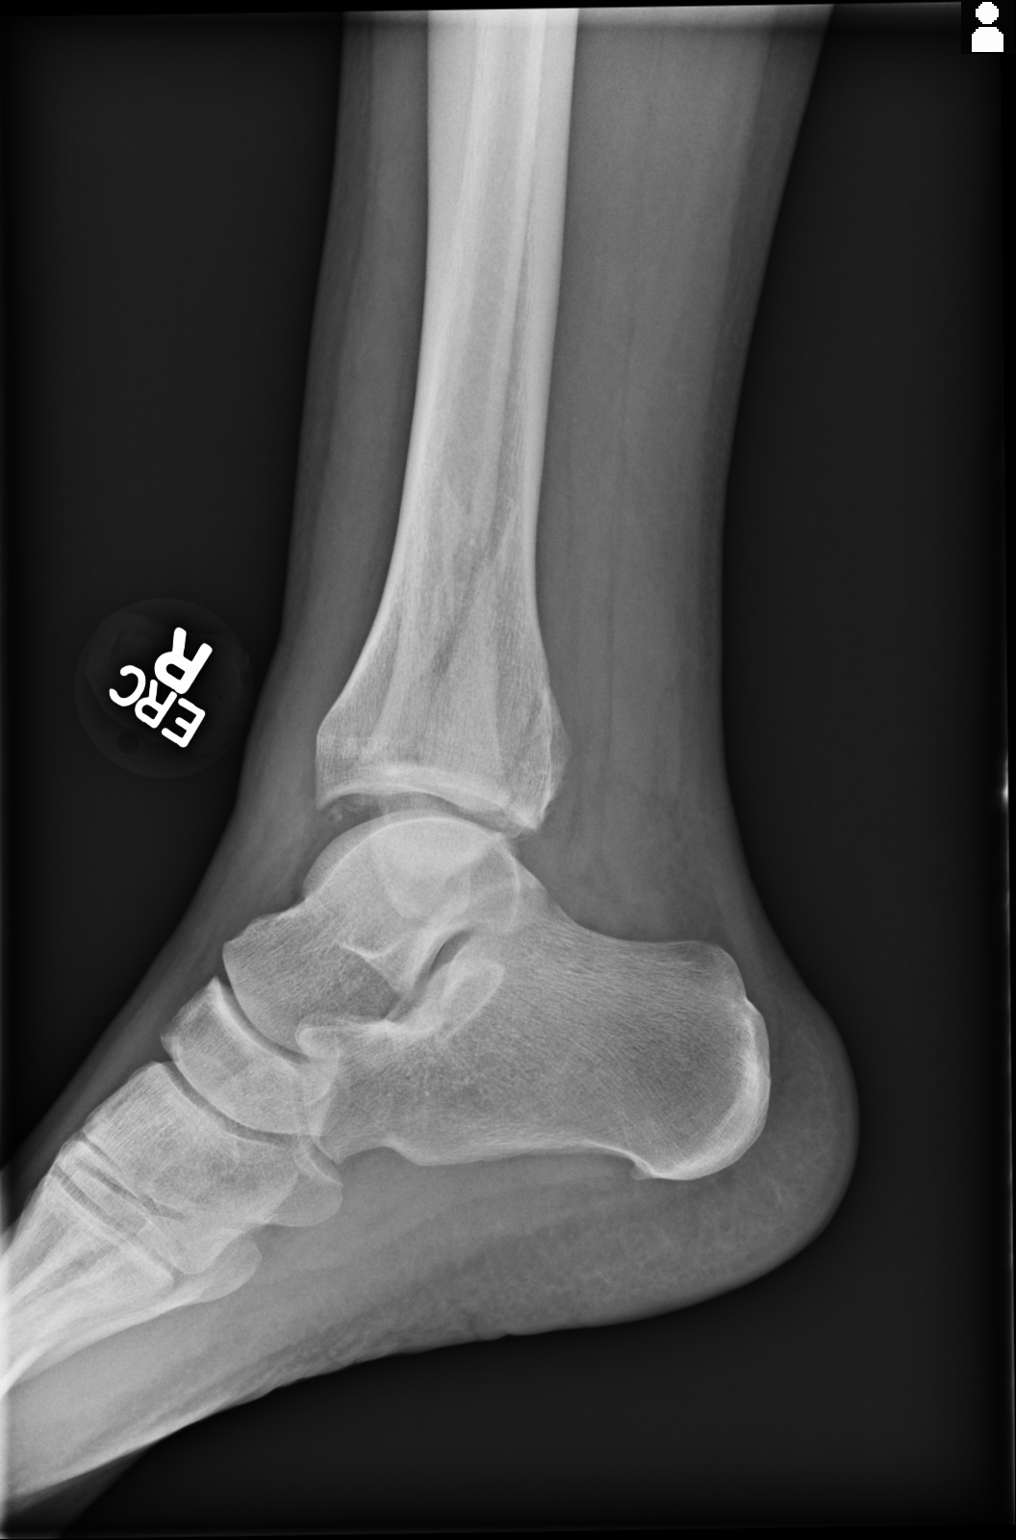

[3 of 3 positions shown; findings below may reference images not displayed]

FINDINGS: There is a complex try malleolar fracture of the right ankle with
spiral comminuted fracture of the distal fibula with mild
distruction, comminuted transverse fracture of the lateral malleolus
with moderate distruction of the distal fracture fragment, and
minimally displaced longitudinal fracture of the posterior
malleolus. Ankle mortise is disrupted with intra-articular extension
of all the fracture lines. There is an associated joint effusion and
soft tissue swelling.
IMPRESSION: Complex try malleolar intraarticular fracture of the right ankle.

## 2017-06-29 IMAGING — CT CT ANKLE*R* W/O CM
4 of 5 series · 14 of 35 positions shown, 16 images · non-contrast
Comparison: Ankle Radiographs performed 08/04/15
COMPARISON: None.

ADDENDUM:
This study is being viewed for the first time currently, as the
study was somehow processed as dictated due to technical error on
08/04/15.
CLINICAL DATA: Patient fell on driveway 08/04/15, trimalleolar
fracture
TECHNIQUE: Multidetector CT imaging of the right ankle was performed according
to the standard protocol. Multiplanar CT image reconstructions were
also generated.

[Series 11: r ankle st sag · sagittal · 0.29mm/px · 5 of 75 slices shown, 6 images]
[im 25/75  bone]
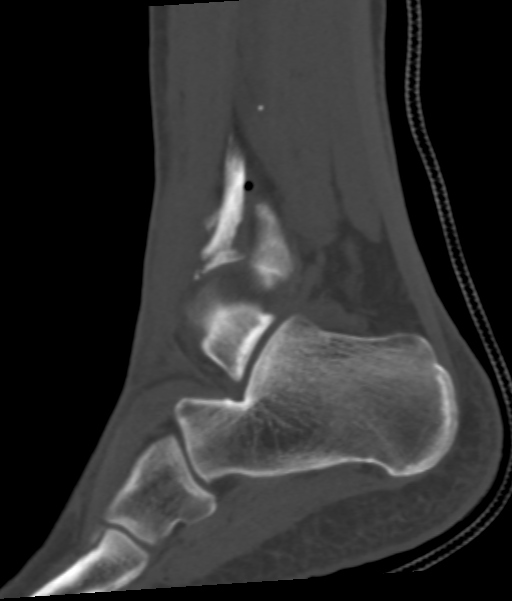
[im 31/75  bone]
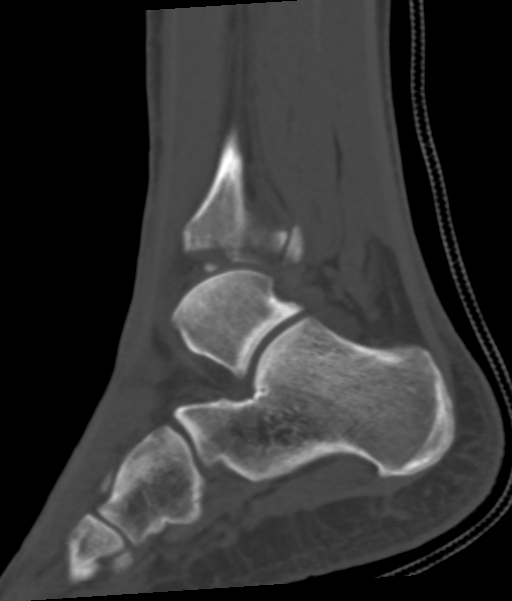
[im 38/75  soft-tissue]
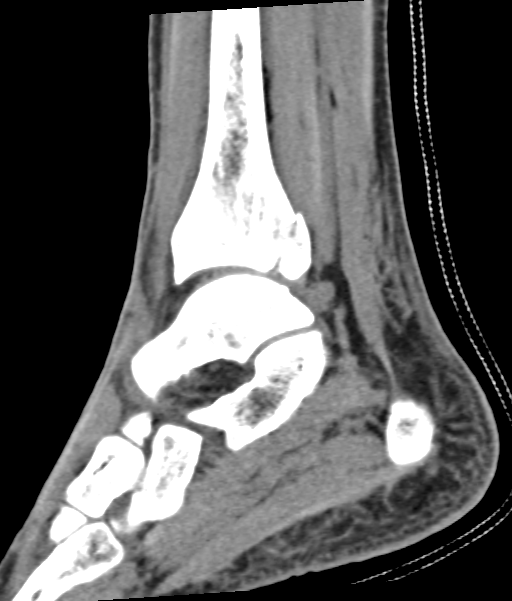
[im 38/75  bone]
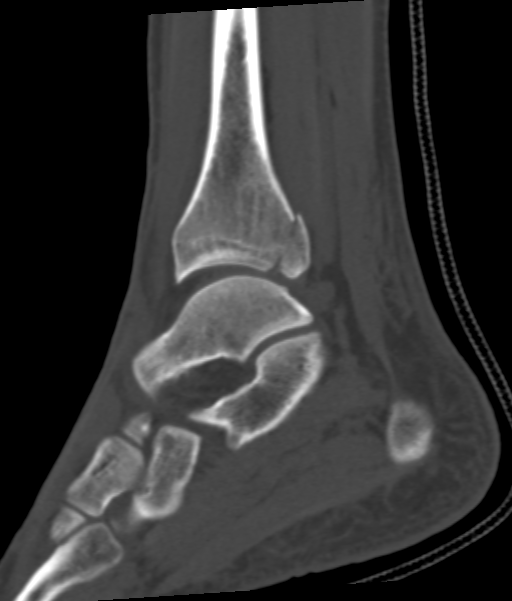
[im 44/75  bone]
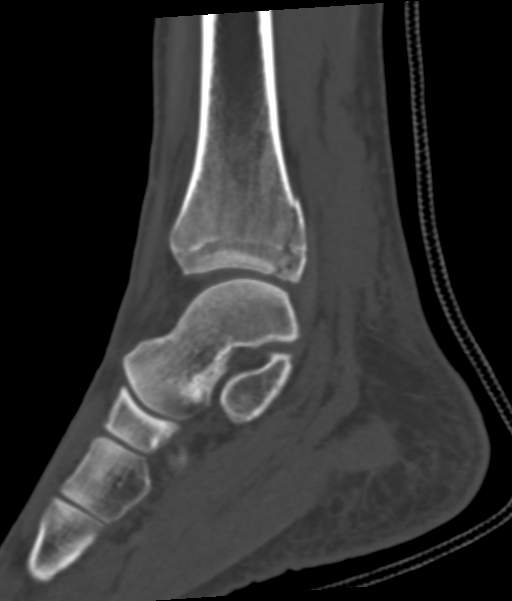
[im 50/75  bone]
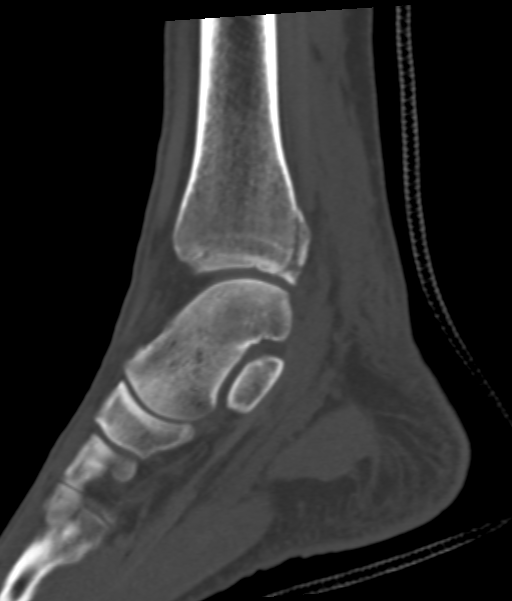

[Series 13: r ankle st cor--- · coronal · 0.21mm/px · 3 of 133 slices shown]
[im 27/133  bone]
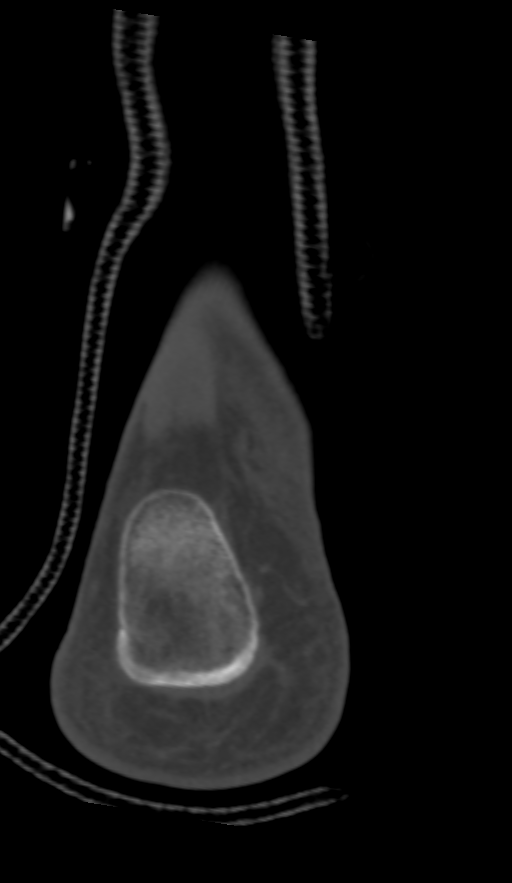
[im 53/133  bone]
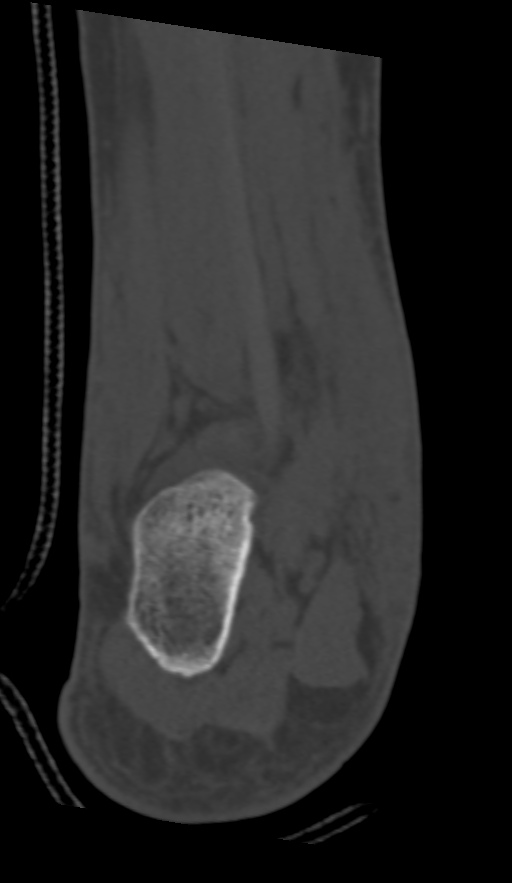
[im 80/133  bone]
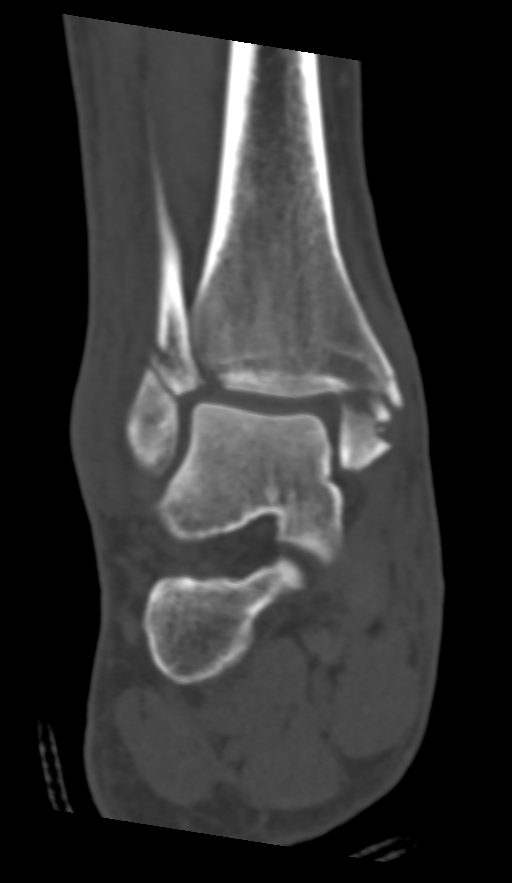

[Series 14: r ankle bone · axial · 0.27mm/px · z∈[+83,+171]mm · 3 of 181 slices shown, 4 images]
[im 46/181  soft-tissue]
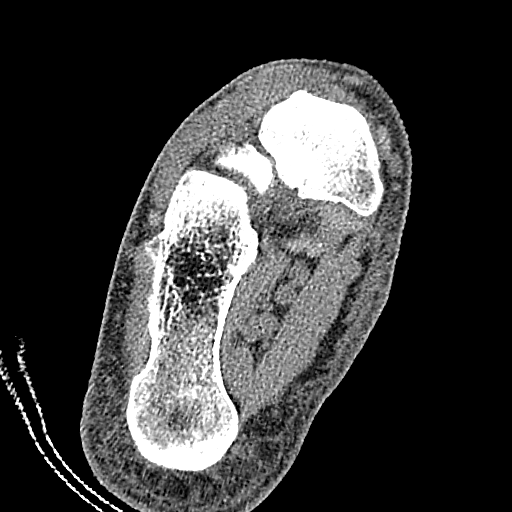
[im 46/181  bone]
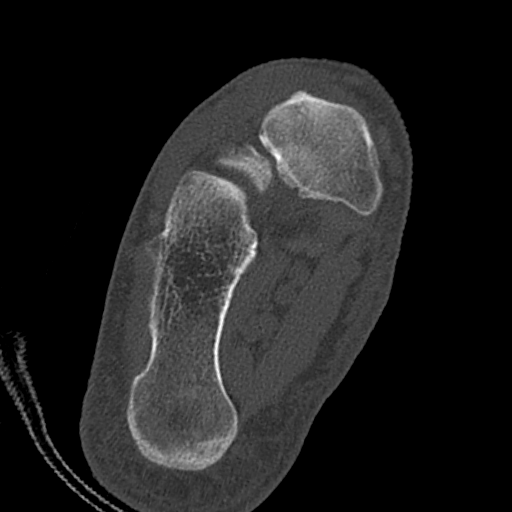
[im 91/181  bone]
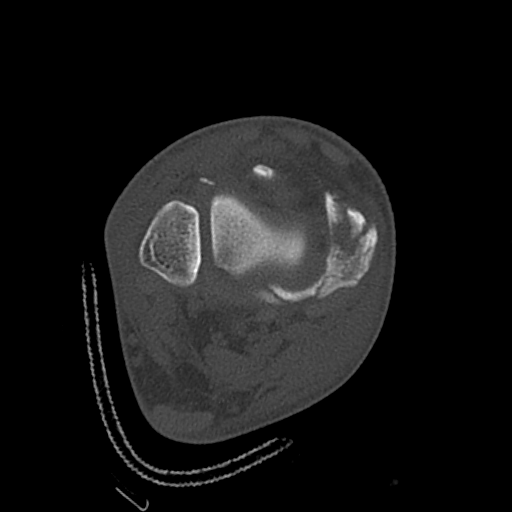
[im 136/181  bone]
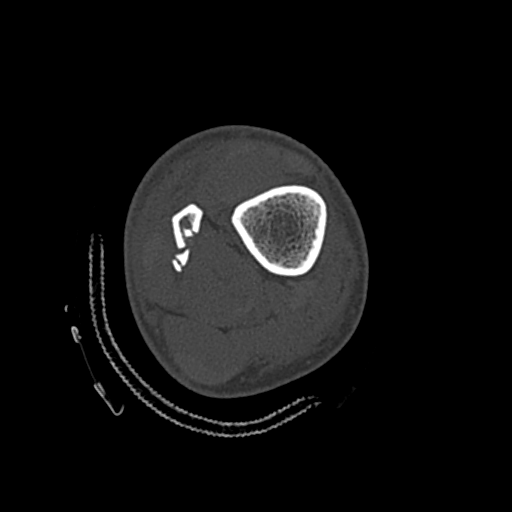

[Series 15: r ankle st · axial · 0.27mm/px · z∈[+81,+169]mm · 3 of 180 slices shown]
[im 45/180  bone]
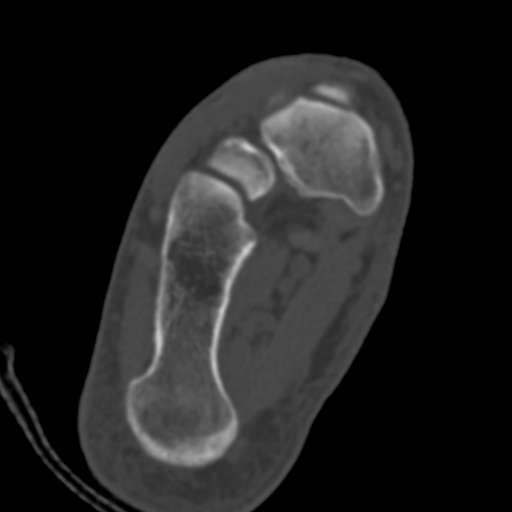
[im 90/180  bone]
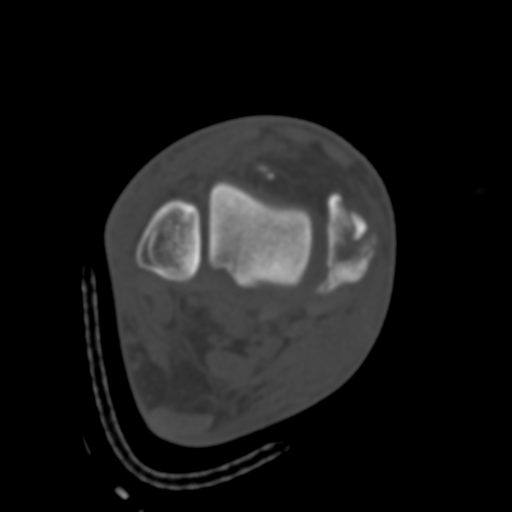
[im 135/180  bone]
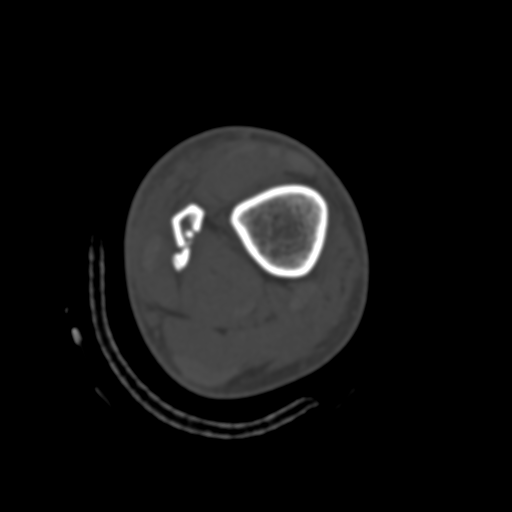

[14 of 35 positions shown; findings below may reference images not displayed]

FINDINGS: There is a comminuted fracture of the medial malleolus.
The medial malleolus is distracted by about 5 mm from underlying
parent bone. The mortise is widened medially. There is a very thin 1
cm long strip of bone immediately adjacent to the medial aspect of
the distal tibial metaphysis without definite underlying donor site
at this level. It appears to originate from the more posterior
aspect of the medial malleolus and to have migrated approximately 2
cm proximally.

There is a spiral fracture of the distal fibula beginning in the
distal shaft and extending to the junction of the shaft and lateral
malleolus. Distal fracture fragment is displaced distally by about 1
cm. This is also a comminuted fracture. Additionally, the lateral
margin of the anterior tibia appears to have suffered avulsion
fracture which is displaced laterally by about 5 mm.

There is a fracture of the posterior malleolus which is displaced
posteriorly by 1-2 mm only. This fracture is also mildly comminuted
and extends into the ankle joint.

Talus and calcaneus do not show evidence of fracture. There is only
a minimal ankle joint effusion.

There is a tiny subcortical sclerotic lesion involving the distal
shaft of the tibia just above the metaphysis, along the endosteal
surface of the lateral cortex. It measures 5 x 5 x 10 mm, and is
likely either a benign bone island or ossifying nonossifying
fibroma.
IMPRESSION: Complex trimalleolar fracture

EXAM:
CT OF THE RIGHT ANKLE WITHOUT CONTRAST

## 2017-10-29 ENCOUNTER — Other Ambulatory Visit: Payer: Self-pay | Admitting: *Deleted

## 2017-10-29 MED ORDER — ALBUTEROL SULFATE HFA 108 (90 BASE) MCG/ACT IN AERS: 2.0000 | INHALATION_SPRAY | Freq: Four times a day (QID) | RESPIRATORY_TRACT | 5 refills | Status: DC | PRN

## 2017-12-04 ENCOUNTER — Other Ambulatory Visit: Payer: No Typology Code available for payment source

## 2017-12-06 ENCOUNTER — Telehealth: Payer: Self-pay | Admitting: Family Medicine

## 2017-12-06 DIAGNOSIS — E7800 Pure hypercholesterolemia, unspecified: Secondary | ICD-10-CM

## 2017-12-06 DIAGNOSIS — I10 Essential (primary) hypertension: Secondary | ICD-10-CM

## 2017-12-06 NOTE — Telephone Encounter (Signed)
-----   Message from Alvina Chou sent at 12/01/2017 11:25 AM EDT ----- Regarding: Lab orders for Monday, 5.13.19 Patient is scheduled for CPX labs, please order future labs, Thanks , Camelia Eng

## 2017-12-07 ENCOUNTER — Other Ambulatory Visit (INDEPENDENT_AMBULATORY_CARE_PROVIDER_SITE_OTHER): Payer: No Typology Code available for payment source

## 2017-12-07 DIAGNOSIS — I10 Essential (primary) hypertension: Secondary | ICD-10-CM

## 2017-12-07 DIAGNOSIS — E7800 Pure hypercholesterolemia, unspecified: Secondary | ICD-10-CM

## 2017-12-07 LAB — LIPID PANEL
Cholesterol: 184 mg/dL (ref 0–200)
HDL: 74.8 mg/dL (ref >39.00)
NONHDL: 109.18
Total CHOL/HDL Ratio: 2
Triglycerides: 249 mg/dL — ABNORMAL HIGH (ref 0.0–149.0)
VLDL: 49.8 mg/dL — ABNORMAL HIGH (ref 0.0–40.0)

## 2017-12-07 LAB — CBC WITH DIFFERENTIAL/PLATELET
Basophils Absolute: 0 10*3/uL (ref 0.0–0.1)
Basophils Relative: 0.5 % (ref 0.0–3.0)
EOS PCT: 3.6 % (ref 0.0–5.0)
Eosinophils Absolute: 0.3 10*3/uL (ref 0.0–0.7)
HCT: 47.7 % (ref 39.0–52.0)
Hemoglobin: 16.1 g/dL (ref 13.0–17.0)
LYMPHS ABS: 2.3 10*3/uL (ref 0.7–4.0)
Lymphocytes Relative: 29.3 % (ref 12.0–46.0)
MCHC: 33.8 g/dL (ref 30.0–36.0)
MCV: 84.8 fl (ref 78.0–100.0)
MONO ABS: 0.8 10*3/uL (ref 0.1–1.0)
Monocytes Relative: 9.9 % (ref 3.0–12.0)
NEUTROS ABS: 4.4 10*3/uL (ref 1.4–7.7)
NEUTROS PCT: 56.7 % (ref 43.0–77.0)
PLATELETS: 369 10*3/uL (ref 150.0–400.0)
RBC: 5.62 Mil/uL (ref 4.22–5.81)
RDW: 13.6 % (ref 11.5–15.5)
WBC: 7.7 10*3/uL (ref 4.0–10.5)

## 2017-12-07 LAB — COMPREHENSIVE METABOLIC PANEL
ALK PHOS: 89 U/L (ref 39–117)
ALT: 27 U/L (ref 0–53)
AST: 25 U/L (ref 0–37)
Albumin: 4.6 g/dL (ref 3.5–5.2)
BUN: 9 mg/dL (ref 6–23)
CO2: 25 mEq/L (ref 19–32)
Calcium: 9.8 mg/dL (ref 8.4–10.5)
Chloride: 98 mEq/L (ref 96–112)
Creatinine, Ser: 1.2 mg/dL (ref 0.40–1.50)
GFR: 71.8 mL/min (ref >60.00)
GLUCOSE: 106 mg/dL — AB (ref 70–99)
POTASSIUM: 3.7 meq/L (ref 3.5–5.1)
SODIUM: 135 meq/L (ref 135–145)
TOTAL PROTEIN: 7.8 g/dL (ref 6.0–8.3)
Total Bilirubin: 0.8 mg/dL (ref 0.2–1.2)

## 2017-12-07 LAB — LDL CHOLESTEROL, DIRECT: Direct LDL: 87 mg/dL

## 2017-12-07 LAB — TSH: TSH: 3 u[IU]/mL (ref 0.35–4.50)

## 2017-12-11 ENCOUNTER — Encounter: Payer: Self-pay | Admitting: Family Medicine

## 2017-12-11 ENCOUNTER — Ambulatory Visit (INDEPENDENT_AMBULATORY_CARE_PROVIDER_SITE_OTHER): Payer: No Typology Code available for payment source | Admitting: Family Medicine

## 2017-12-11 VITALS — BP 130/80 | HR 96 | Temp 98.3°F | Ht 67.5 in | Wt 166.2 lb

## 2017-12-11 DIAGNOSIS — E7800 Pure hypercholesterolemia, unspecified: Secondary | ICD-10-CM | POA: Diagnosis not present

## 2017-12-11 DIAGNOSIS — Z0000 Encounter for general adult medical examination without abnormal findings: Secondary | ICD-10-CM

## 2017-12-11 DIAGNOSIS — I10 Essential (primary) hypertension: Secondary | ICD-10-CM | POA: Diagnosis not present

## 2017-12-11 MED ORDER — AMLODIPINE BESYLATE 10 MG PO TABS: 10.0000 mg | ORAL_TABLET | Freq: Every day | ORAL | 3 refills | Status: DC

## 2017-12-11 MED ORDER — SIMVASTATIN 20 MG PO TABS: 20.0000 mg | ORAL_TABLET | Freq: Every day | ORAL | 3 refills | Status: DC

## 2017-12-11 NOTE — Progress Notes (Signed)
Subjective:    Patient ID: Robert Dickson, male    DOB: 12-Feb-1981, 37 y.o.   MRN: [id number]  HPI  Here for health maintenance exam and to review chronic medical problems   A hard time with allergies during pollen - may have had a sinus infection  Used otc meds  Better now    No new family hx  PGM - had prostate cancer   Wt Readings from Last 3 Encounters:  12/11/17 166 lb 4 oz (75.4 kg)  12/10/16 172 lb 12 oz (78.4 kg)  06/10/16 178 lb 12 oz (81.1 kg)  more active- loosing weight - now working more - very physical  Tries to eat healthy  25.65 kg/m   Flu shot -did not get this season   Tetanus shot 5/12  bp is up on first check today- unsure why (had a big soda on the way here)  Better on 2nd check 130/80  No cp or palpitations or headaches or edema  No side effects to medicines  BP Readings from Last 3 Encounters:  12/11/17 (!) 152/86  12/10/16 138/82  06/10/16 135/85     2nd check BP: 130/80 -improved after sitting 10 min   Hyperlipidemia Lab Results  Component Value Date   CHOL 184 12/07/2017   CHOL 187 12/05/2016   CHOL 222 (H) 11/29/2015   Lab Results  Component Value Date   HDL 74.80 12/07/2017   HDL 64.10 12/05/2016   HDL 78.50 11/29/2015   Lab Results  Component Value Date   LDLCALC 88 12/05/2016   LDLCALC 112 (H) 06/27/2014   LDLCALC 107 (H) 12/02/2010   Lab Results  Component Value Date   TRIG 249.0 (H) 12/07/2017   TRIG 173.0 (H) 12/05/2016   TRIG 309.0 (H) 11/29/2015   Lab Results  Component Value Date   CHOLHDL 2 12/07/2017   CHOLHDL 3 12/05/2016   CHOLHDL 3 11/29/2015   Lab Results  Component Value Date   LDLDIRECT 87.0 12/07/2017   LDLDIRECT 115.0 11/29/2015   LDLDIRECT 102.5 07/20/2013  simvastatin and diet  Very good profile with improvement  Has cut out fast food entirely  Eating more clean    Fasting glucose 106 Improved this time  Not a sweet eater   Lab Results  Component Value Date   WBC 7.7 12/07/2017   HGB 16.1 12/07/2017   HCT 47.7 12/07/2017   MCV 84.8 12/07/2017   PLT 369.0 12/07/2017   Lab Results  Component Value Date   CREATININE 1.20 12/07/2017   BUN 9 12/07/2017   NA 135 12/07/2017   K 3.7 12/07/2017   CL 98 12/07/2017   CO2 25 12/07/2017   Lab Results  Component Value Date   ALT 27 12/07/2017   AST 25 12/07/2017   ALKPHOS 89 12/07/2017   BILITOT 0.8 12/07/2017   Lab Results  Component Value Date   TSH 3.00 12/07/2017    Patient Active Problem List   Diagnosis Date Noted  . Trimalleolar fracture 08/04/2015  . Plantar wart, left foot 07/20/2013  . Routine general medical examination at a health care facility 12/09/2010  . ALLERGIC RHINITIS 04/05/2010  . Hyperlipidemia 11/11/2006  . Essential hypertension 11/11/2006  . MURMUR 11/11/2006   Past Medical History:  Diagnosis Date  . Allergy    allergic rhinitis  . External hemorrhoid   . Hyperlipidemia   . Hypertension    Past Surgical History:  Procedure Laterality Date  . ORIF ANKLE FRACTURE Right 08/05/2015  Procedure: OPEN REDUCTION INTERNAL FIXATION (ORIF) ANKLE FRACTURE;  Surgeon: Kennedy Bucker, MD;  Location: [facility] ORS;  Service: Orthopedics;  Laterality: Right;   Social History   Tobacco Use  . Smoking status: Never Smoker  . Smokeless tobacco: Former Engineer, water Use Topics  . Alcohol use: Yes    Alcohol/week: 0.0 oz    Comment: 1-2 drinks/day(a littlem ore in summer)  . Drug use: No   Family History  Problem Relation Age of Onset  . Hypertension Father    Allergies  Allergen Reactions  . Lisinopril Cough    ? cough   Current Outpatient Medications on File Prior to Visit  Medication Sig Dispense Refill  . albuterol (PROAIR HFA) 108 (90 Base) MCG/ACT inhaler Inhale 2 puffs into the lungs every 6 (six) hours as needed for wheezing or shortness of breath. 1 Inhaler 5  . Multiple Vitamin (MULTIVITAMIN) capsule Take 1 capsule by mouth daily.       No current facility-administered  medications on file prior to visit.     Review of Systems  Constitutional: Negative for activity change, appetite change, fatigue, fever and unexpected weight change.  HENT: Negative for congestion, rhinorrhea, sore throat and trouble swallowing.   Eyes: Negative for pain, redness, itching and visual disturbance.  Respiratory: Negative for cough, chest tightness, shortness of breath and wheezing.   Cardiovascular: Negative for chest pain and palpitations.  Gastrointestinal: Negative for abdominal pain, blood in stool, constipation, diarrhea and nausea.  Endocrine: Negative for cold intolerance, heat intolerance, polydipsia and polyuria.  Genitourinary: Negative for difficulty urinating, dysuria, frequency and urgency.  Musculoskeletal: Negative for arthralgias, joint swelling and myalgias.  Skin: Negative for pallor and rash.  Neurological: Negative for dizziness, tremors, weakness, numbness and headaches.  Hematological: Negative for adenopathy. Does not bruise/bleed easily.  Psychiatric/Behavioral: Negative for decreased concentration and dysphoric mood. The patient is not nervous/anxious.        Objective:   Physical Exam  Constitutional: He appears well-developed and well-nourished. No distress.  Well appearing   HENT:  Head: Normocephalic and atraumatic.  Right Ear: External ear normal.  Left Ear: External ear normal.  Nose: Nose normal.  Mouth/Throat: Oropharynx is clear and moist.  Nares are boggy  Eyes: Pupils are equal, round, and reactive to light. Conjunctivae and EOM are normal. Right eye exhibits no discharge. Left eye exhibits no discharge. No scleral icterus.  Neck: Normal range of motion. Neck supple. No JVD present. Carotid bruit is not present. No thyromegaly present.  Cardiovascular: Normal rate, regular rhythm, normal heart sounds and intact distal pulses. Exam reveals no gallop.  Pulmonary/Chest: Effort normal and breath sounds normal. No respiratory distress. He  has no wheezes. He exhibits no tenderness.  Abdominal: Soft. Bowel sounds are normal. He exhibits no distension, no abdominal bruit and no mass. There is no tenderness.  Musculoskeletal: He exhibits no edema or tenderness.  Lymphadenopathy:    He has no cervical adenopathy.  Neurological: He is alert. He has normal reflexes. No cranial nerve deficit. He exhibits normal muscle tone. Coordination normal.  Skin: Skin is warm and dry. No rash noted. No erythema. No pallor.  Solar lentigines diffusely Brown nevi on trunk -different sizes   Psychiatric: He has a normal mood and affect.          Assessment & Plan:   Problem List Items Addressed This Visit      Cardiovascular and Mediastinum   Essential hypertension    bp in fair control at  this time  BP Readings from Last 1 Encounters:  12/11/17 130/80   No changes needed Most recent labs reviewed  Disc lifstyle change with low sodium diet and exercise  Continue amlodipine       Relevant Medications   amLODipine (NORVASC) 10 MG tablet   simvastatin (ZOCOR) 20 MG tablet     Other   Hyperlipidemia    Disc goals for lipids and reasons to control them Rev last labs with pt Rev low sat fat diet in detail Continue simvastatin        Relevant Medications   amLODipine (NORVASC) 10 MG tablet   simvastatin (ZOCOR) 20 MG tablet   Routine general medical examination at a health care facility - Primary    Reviewed health habits including diet and exercise and skin cancer prevention Reviewed appropriate screening tests for age  Also reviewed health mt list, fam hx and immunization status , as well as social and family history   See HPI Labs rev  Enc further work on diet and exercise  F/u with derm for skin eval

## 2017-12-11 NOTE — Patient Instructions (Addendum)
Take care of yourself   Keep trying to eat a healthy diet (less fats and refined carbs)  Sounds like you are doing better with this  Stay active  See the dermatologist as planneed

## 2017-12-13 NOTE — Assessment & Plan Note (Signed)
Reviewed health habits including diet and exercise and skin cancer prevention Reviewed appropriate screening tests for age  Also reviewed health mt list, fam hx and immunization status , as well as social and family history   See HPI Labs rev  Enc further work on diet and exercise  F/u with derm for skin eval

## 2017-12-13 NOTE — Assessment & Plan Note (Signed)
bp in fair control at this time  BP Readings from Last 1 Encounters:  12/11/17 130/80   No changes needed Most recent labs reviewed  Disc lifstyle change with low sodium diet and exercise  Continue amlodipine

## 2017-12-13 NOTE — Assessment & Plan Note (Addendum)
Disc goals for lipids and reasons to control them Rev last labs with pt Rev low sat fat diet in detail  Continue simvastatin  

## 2018-12-03 ENCOUNTER — Telehealth: Payer: Self-pay | Admitting: Family Medicine

## 2018-12-03 NOTE — Telephone Encounter (Signed)
I left a message for patient to return my call.  Patient needs to r/s his lab and cpx appointment to summer.

## 2018-12-10 ENCOUNTER — Other Ambulatory Visit: Payer: No Typology Code available for payment source

## 2018-12-14 ENCOUNTER — Encounter: Payer: No Typology Code available for payment source | Admitting: Family Medicine

## 2019-02-04 ENCOUNTER — Other Ambulatory Visit: Payer: Self-pay | Admitting: *Deleted

## 2019-02-04 MED ORDER — AMLODIPINE BESYLATE 10 MG PO TABS: 10.0000 mg | ORAL_TABLET | Freq: Every day | ORAL | 0 refills | Status: DC

## 2019-02-04 MED ORDER — SIMVASTATIN 20 MG PO TABS: 20.0000 mg | ORAL_TABLET | Freq: Every day | ORAL | 0 refills | Status: DC

## 2019-02-04 NOTE — Addendum Note (Signed)
Addended by: Tammi Sou on: 02/04/2019 12:59 PM   Modules accepted: Orders

## 2019-03-03 ENCOUNTER — Telehealth: Payer: Self-pay

## 2019-03-03 NOTE — Telephone Encounter (Signed)
Left message to call clinic, needs COVID screen and back door lab info and front DR appt info   

## 2019-03-06 ENCOUNTER — Telehealth: Payer: Self-pay | Admitting: Family Medicine

## 2019-03-06 DIAGNOSIS — I10 Essential (primary) hypertension: Secondary | ICD-10-CM

## 2019-03-06 DIAGNOSIS — E7800 Pure hypercholesterolemia, unspecified: Secondary | ICD-10-CM

## 2019-03-06 NOTE — Telephone Encounter (Signed)
-----   Message from Ellamae Sia sent at 02/28/2019  2:25 PM EDT ----- Regarding: Lab orders for Monday, 8.10.20 Patient is scheduled for CPX labs, please order future labs, Thanks , Karna Christmas

## 2019-03-07 ENCOUNTER — Other Ambulatory Visit: Payer: Self-pay

## 2019-03-07 ENCOUNTER — Other Ambulatory Visit (INDEPENDENT_AMBULATORY_CARE_PROVIDER_SITE_OTHER): Payer: Commercial Managed Care - PPO

## 2019-03-07 DIAGNOSIS — I10 Essential (primary) hypertension: Secondary | ICD-10-CM

## 2019-03-07 DIAGNOSIS — E7800 Pure hypercholesterolemia, unspecified: Secondary | ICD-10-CM | POA: Diagnosis not present

## 2019-03-07 LAB — COMPREHENSIVE METABOLIC PANEL
ALT: 25 U/L (ref 0–53)
AST: 22 U/L (ref 0–37)
Albumin: 4.8 g/dL (ref 3.5–5.2)
Alkaline Phosphatase: 88 U/L (ref 39–117)
BUN: 9 mg/dL (ref 6–23)
CO2: 28 mEq/L (ref 19–32)
Calcium: 10.3 mg/dL (ref 8.4–10.5)
Chloride: 100 mEq/L (ref 96–112)
Creatinine, Ser: 1.26 mg/dL (ref 0.40–1.50)
GFR: 63.45 mL/min (ref >60.00)
Glucose, Bld: 95 mg/dL (ref 70–99)
Potassium: 4.5 mEq/L (ref 3.5–5.1)
Sodium: 139 mEq/L (ref 135–145)
Total Bilirubin: 0.7 mg/dL (ref 0.2–1.2)
Total Protein: 7.2 g/dL (ref 6.0–8.3)

## 2019-03-07 LAB — LIPID PANEL
Cholesterol: 206 mg/dL — ABNORMAL HIGH (ref 0–200)
HDL: 64.9 mg/dL (ref >39.00)
LDL Cholesterol: 112 mg/dL — ABNORMAL HIGH (ref 0–99)
NonHDL: 141.4
Total CHOL/HDL Ratio: 3
Triglycerides: 148 mg/dL (ref 0.0–149.0)
VLDL: 29.6 mg/dL (ref 0.0–40.0)

## 2019-03-07 LAB — CBC WITH DIFFERENTIAL/PLATELET
Basophils Absolute: 0 10*3/uL (ref 0.0–0.1)
Basophils Relative: 0.4 % (ref 0.0–3.0)
Eosinophils Absolute: 0.5 10*3/uL (ref 0.0–0.7)
Eosinophils Relative: 5.5 % — ABNORMAL HIGH (ref 0.0–5.0)
HCT: 46.8 % (ref 39.0–52.0)
Hemoglobin: 15.7 g/dL (ref 13.0–17.0)
Lymphocytes Relative: 30.2 % (ref 12.0–46.0)
Lymphs Abs: 2.5 10*3/uL (ref 0.7–4.0)
MCHC: 33.6 g/dL (ref 30.0–36.0)
MCV: 85.5 fl (ref 78.0–100.0)
Monocytes Absolute: 0.9 10*3/uL (ref 0.1–1.0)
Monocytes Relative: 11 % (ref 3.0–12.0)
Neutro Abs: 4.4 10*3/uL (ref 1.4–7.7)
Neutrophils Relative %: 52.9 % (ref 43.0–77.0)
Platelets: 325 10*3/uL (ref 150.0–400.0)
RBC: 5.47 Mil/uL (ref 4.22–5.81)
RDW: 13.3 % (ref 11.5–15.5)
WBC: 8.4 10*3/uL (ref 4.0–10.5)

## 2019-03-07 LAB — TSH: TSH: 2.71 u[IU]/mL (ref 0.35–4.50)

## 2019-03-10 ENCOUNTER — Other Ambulatory Visit: Payer: Self-pay

## 2019-03-10 ENCOUNTER — Encounter: Payer: Self-pay | Admitting: Family Medicine

## 2019-03-10 ENCOUNTER — Ambulatory Visit (INDEPENDENT_AMBULATORY_CARE_PROVIDER_SITE_OTHER): Payer: Commercial Managed Care - PPO | Admitting: Family Medicine

## 2019-03-10 VITALS — BP 138/80 | HR 86 | Temp 98.4°F | Ht 67.25 in | Wt 172.4 lb

## 2019-03-10 DIAGNOSIS — Z8042 Family history of malignant neoplasm of prostate: Secondary | ICD-10-CM | POA: Diagnosis not present

## 2019-03-10 DIAGNOSIS — I10 Essential (primary) hypertension: Secondary | ICD-10-CM

## 2019-03-10 DIAGNOSIS — Z0000 Encounter for general adult medical examination without abnormal findings: Secondary | ICD-10-CM | POA: Diagnosis not present

## 2019-03-10 DIAGNOSIS — E7800 Pure hypercholesterolemia, unspecified: Secondary | ICD-10-CM

## 2019-03-10 MED ORDER — AMLODIPINE BESYLATE 10 MG PO TABS: 10.0000 mg | ORAL_TABLET | Freq: Every day | ORAL | 3 refills | Status: DC

## 2019-03-10 MED ORDER — SIMVASTATIN 20 MG PO TABS: 20.0000 mg | ORAL_TABLET | Freq: Every day | ORAL | 3 refills | Status: DC

## 2019-03-10 NOTE — Assessment & Plan Note (Signed)
Disc goals for lipids and reasons to control them Rev last labs with pt Rev low sat fat diet in detail LDL up to 112 with several missed statin doses as well as much more fatty meat intake inst to watch diet more closely and be compliant with medicine

## 2019-03-10 NOTE — Assessment & Plan Note (Signed)
bp in fair control at this time  (much better on 2nd check as usual) BP Readings from Last 1 Encounters:  03/10/19 138/80   No changes needed Most recent labs reviewed  Disc lifstyle change with low sodium diet and exercise

## 2019-03-10 NOTE — Assessment & Plan Note (Signed)
Reviewed health habits including diet and exercise and skin cancer prevention Reviewed appropriate screening tests for age  Also reviewed health mt list, fam hx and immunization status , as well as social and family history   See HPI Labs rev Counseled re: diet and exercise  Also skin protection (he sees derm yearly and was just seen) Plans to get a flu shot this fall

## 2019-03-10 NOTE — Patient Instructions (Addendum)
Get a flu shot this fall please   For cholesterol-don't miss medicine doses  Avoid red meat/ fried foods/ egg yolks/ fatty breakfast meats/ butter, cheese and high fat dairy/ and shellfish   Back off of the fatty BBQ   Take care of yourself

## 2019-03-10 NOTE — Progress Notes (Signed)
Subjective:    Patient ID: Robert Dickson, male    DOB: 09-19-1980, 38 y.o.   MRN: [id number]  HPI Here for health maintenance exam and to review chronic medical problems    Feeling ok  Working  Cancelled a cruise this spring    Wt Readings from Last 3 Encounters:  03/10/19 172 lb 6 oz (78.2 kg)  12/11/17 166 lb 4 oz (75.4 kg)  12/10/16 172 lb 12 oz (78.4 kg)  taking care of himself  Sleeps well when he can get sleep  26.80 kg/m   Mowing -exercise - 2 yards twice weekly  Also work is physical   Flu vaccine does not usually get /will get  Tdap 5/12  Prostate health  No frequent urination  Gets up 0-1 per night to urinate  PGF with prostate cancer hx (he may have been younger than 57)    No new family hx     bp up on the first check- rushing to get here  No cp or palpitations or headaches or edema  No side effects to medicines  BP Readings from Last 3 Encounters:  03/10/19 (!) 146/98  12/11/17 130/80  12/10/16 138/82     Taking amlodipine 10 mg - he forgot to take over the weekend-travel Usually does well with it   Lab Results  Component Value Date   CREATININE 1.26 03/07/2019   BUN 9 03/07/2019   NA 139 03/07/2019   K 4.5 03/07/2019   CL 100 03/07/2019   CO2 28 03/07/2019   Lab Results  Component Value Date   ALT 25 03/07/2019   AST 22 03/07/2019   ALKPHOS 88 03/07/2019   BILITOT 0.7 03/07/2019   Lab Results  Component Value Date   TSH 2.71 03/07/2019    Lab Results  Component Value Date   WBC 8.4 03/07/2019   HGB 15.7 03/07/2019   HCT 46.8 03/07/2019   MCV 85.5 03/07/2019   PLT 325.0 03/07/2019    Hyperlipidemia Lab Results  Component Value Date   CHOL 206 (H) 03/07/2019   CHOL 184 12/07/2017   CHOL 187 12/05/2016   Lab Results  Component Value Date   HDL 64.90 03/07/2019   HDL 74.80 12/07/2017   HDL 64.10 12/05/2016   Lab Results  Component Value Date   LDLCALC 112 (H) 03/07/2019   LDLCALC 88 12/05/2016   LDLCALC 112 (H)  06/27/2014   Lab Results  Component Value Date   TRIG 148.0 03/07/2019   TRIG 249.0 (H) 12/07/2017   TRIG 173.0 (H) 12/05/2016   Lab Results  Component Value Date   CHOLHDL 3 03/07/2019   CHOLHDL 2 12/07/2017   CHOLHDL 3 12/05/2016   Lab Results  Component Value Date   LDLDIRECT 87.0 12/07/2017   LDLDIRECT 115.0 11/29/2015   LDLDIRECT 102.5 07/20/2013   Taking simvastatin 20 mg  Diet - more BBQ - more fatty pork and beef (smoking)    Patient Active Problem List   Diagnosis Date Noted  . H/O fracture of ankle 08/04/2015  . Routine general medical examination at a health care facility 12/09/2010  . ALLERGIC RHINITIS 04/05/2010  . Hyperlipidemia 11/11/2006  . Essential hypertension 11/11/2006  . MURMUR 11/11/2006   Past Medical History:  Diagnosis Date  . Allergy    allergic rhinitis  . External hemorrhoid   . Hyperlipidemia   . Hypertension    Past Surgical History:  Procedure Laterality Date  . ORIF ANKLE FRACTURE Right 08/05/2015   Procedure:  OPEN REDUCTION INTERNAL FIXATION (ORIF) ANKLE FRACTURE;  Surgeon: Kennedy BuckerMichael Menz, MD;  Location: [facility] ORS;  Service: Orthopedics;  Laterality: Right;   Social History   Tobacco Use  . Smoking status: Never Smoker  . Smokeless tobacco: Former Engineer, waterUser  Substance Use Topics  . Alcohol use: Yes    Alcohol/week: 0.0 standard drinks    Comment: 1-2 drinks/day(a littlem ore in summer)  . Drug use: No   Family History  Problem Relation Age of Onset  . Hypertension Father   . Prostate cancer Paternal Grandfather    Allergies  Allergen Reactions  . Lisinopril Cough    ? cough   Current Outpatient Medications on File Prior to Visit  Medication Sig Dispense Refill  . albuterol (PROAIR HFA) 108 (90 Base) MCG/ACT inhaler Inhale 2 puffs into the lungs every 6 (six) hours as needed for wheezing or shortness of breath. 1 Inhaler 5  . Multiple Vitamin (MULTIVITAMIN) capsule Take 1 capsule by mouth daily.       No current  facility-administered medications on file prior to visit.      Review of Systems  Constitutional: Negative for activity change, appetite change, fatigue, fever and unexpected weight change.  HENT: Negative for congestion, rhinorrhea, sore throat and trouble swallowing.   Eyes: Negative for pain, redness, itching and visual disturbance.  Respiratory: Negative for cough, chest tightness, shortness of breath and wheezing.   Cardiovascular: Negative for chest pain and palpitations.  Gastrointestinal: Negative for abdominal pain, blood in stool, constipation, diarrhea and nausea.  Endocrine: Negative for cold intolerance, heat intolerance, polydipsia and polyuria.  Genitourinary: Negative for difficulty urinating, dysuria, frequency and urgency.  Musculoskeletal: Negative for arthralgias, joint swelling and myalgias.  Skin: Negative for pallor and rash.  Neurological: Negative for dizziness, tremors, weakness, numbness and headaches.  Hematological: Negative for adenopathy. Does not bruise/bleed easily.  Psychiatric/Behavioral: Negative for decreased concentration and dysphoric mood. The patient is not nervous/anxious.        Stressors        Objective:   Physical Exam Constitutional:      General: He is not in acute distress.    Appearance: Normal appearance. He is well-developed and normal weight. He is not ill-appearing.  HENT:     Head: Normocephalic and atraumatic.     Right Ear: Tympanic membrane, ear canal and external ear normal.     Left Ear: Tympanic membrane, ear canal and external ear normal.     Nose: Nose normal.     Mouth/Throat:     Mouth: Mucous membranes are moist.     Pharynx: Oropharynx is clear.  Eyes:     General: No scleral icterus.       Right eye: No discharge.        Left eye: No discharge.     Conjunctiva/sclera: Conjunctivae normal.     Pupils: Pupils are equal, round, and reactive to light.  Neck:     Musculoskeletal: Normal range of motion and neck  supple.     Thyroid: No thyromegaly.     Vascular: No carotid bruit or JVD.  Cardiovascular:     Rate and Rhythm: Normal rate and regular rhythm.     Pulses: Normal pulses.     Heart sounds: Normal heart sounds. No gallop.      Comments: Pt has a h/o heart M I did not appreciate it today Pulmonary:     Effort: Pulmonary effort is normal. No respiratory distress.     Breath sounds:  Normal breath sounds. No wheezing.     Comments: Good air exch Chest:     Chest wall: No tenderness.  Abdominal:     General: Bowel sounds are normal. There is no distension or abdominal bruit.     Palpations: Abdomen is soft. There is no mass.     Tenderness: There is no abdominal tenderness.  Musculoskeletal:        General: No tenderness.     Right lower leg: No edema.     Left lower leg: No edema.  Lymphadenopathy:     Cervical: No cervical adenopathy.  Skin:    General: Skin is warm and dry.     Coloration: Skin is not pale.     Findings: No erythema or rash.     Comments: Solar lentigines diffusely Mildly tanned  Neurological:     Mental Status: He is alert.     Cranial Nerves: No cranial nerve deficit.     Motor: No abnormal muscle tone.     Coordination: Coordination normal.     Gait: Gait normal.     Deep Tendon Reflexes: Reflexes are normal and symmetric. Reflexes normal.  Psychiatric:        Mood and Affect: Mood normal.           Assessment & Plan:   Problem List Items Addressed This Visit      Cardiovascular and Mediastinum   Essential hypertension    bp in fair control at this time  (much better on 2nd check as usual) BP Readings from Last 1 Encounters:  03/10/19 138/80   No changes needed Most recent labs reviewed  Disc lifstyle change with low sodium diet and exercise        Relevant Medications   amLODipine (NORVASC) 10 MG tablet   simvastatin (ZOCOR) 20 MG tablet     Other   Hyperlipidemia    Disc goals for lipids and reasons to control them Rev last  labs with pt Rev low sat fat diet in detail LDL up to 112 with several missed statin doses as well as much more fatty meat intake inst to watch diet more closely and be compliant with medicine       Relevant Medications   amLODipine (NORVASC) 10 MG tablet   simvastatin (ZOCOR) 20 MG tablet   Routine general medical examination at a health care facility - Primary    Reviewed health habits including diet and exercise and skin cancer prevention Reviewed appropriate screening tests for age  Also reviewed health mt list, fam hx and immunization status , as well as social and family history   See HPI Labs rev Counseled re: diet and exercise  Also skin protection (he sees derm yearly and was just seen) Plans to get a flu shot this fall

## 2019-11-13 ENCOUNTER — Emergency Department
Admission: EM | Admit: 2019-11-13 | Discharge: 2019-11-13 | Disposition: A | Discharge status: Home / Self Care | Payer: Commercial Managed Care - PPO | Attending: Emergency Medicine | Admitting: Emergency Medicine

## 2019-11-13 ENCOUNTER — Other Ambulatory Visit: Payer: Self-pay

## 2019-11-13 ENCOUNTER — Emergency Department: Payer: Commercial Managed Care - PPO

## 2019-11-13 ENCOUNTER — Encounter: Payer: Self-pay | Admitting: Emergency Medicine

## 2019-11-13 DIAGNOSIS — Z79899 Other long term (current) drug therapy: Secondary | ICD-10-CM | POA: Diagnosis not present

## 2019-11-13 DIAGNOSIS — R0602 Shortness of breath: Secondary | ICD-10-CM

## 2019-11-13 DIAGNOSIS — I10 Essential (primary) hypertension: Secondary | ICD-10-CM | POA: Diagnosis not present

## 2019-11-13 DIAGNOSIS — U071 COVID-19: Secondary | ICD-10-CM | POA: Insufficient documentation

## 2019-11-13 DIAGNOSIS — E860 Dehydration: Secondary | ICD-10-CM

## 2019-11-13 LAB — CBC WITH DIFFERENTIAL/PLATELET
Abs Immature Granulocytes: 0.07 10*3/uL (ref 0.00–0.07)
Basophils Absolute: 0 10*3/uL (ref 0.0–0.1)
Basophils Relative: 0 %
Eosinophils Absolute: 0 10*3/uL (ref 0.0–0.5)
Eosinophils Relative: 0 %
HCT: 45.8 % (ref 39.0–52.0)
Hemoglobin: 15.7 g/dL (ref 13.0–17.0)
Immature Granulocytes: 1 %
Lymphocytes Relative: 15 %
Lymphs Abs: 1 10*3/uL (ref 0.7–4.0)
MCH: 28.5 pg (ref 26.0–34.0)
MCHC: 34.3 g/dL (ref 30.0–36.0)
MCV: 83.1 fL (ref 80.0–100.0)
Monocytes Absolute: 0.4 10*3/uL (ref 0.1–1.0)
Monocytes Relative: 6 %
Neutro Abs: 5.1 10*3/uL (ref 1.7–7.7)
Neutrophils Relative %: 78 %
Platelets: 284 10*3/uL (ref 150–400)
RBC: 5.51 MIL/uL (ref 4.22–5.81)
RDW: 12.6 % (ref 11.5–15.5)
WBC: 6.5 10*3/uL (ref 4.0–10.5)
nRBC: 0 % (ref 0.0–0.2)

## 2019-11-13 LAB — COMPREHENSIVE METABOLIC PANEL
ALT: 37 U/L (ref 0–44)
AST: 62 U/L — ABNORMAL HIGH (ref 15–41)
Albumin: 3.8 g/dL (ref 3.5–5.0)
Alkaline Phosphatase: 59 U/L (ref 38–126)
Anion gap: 11 (ref 5–15)
BUN: 28 mg/dL — ABNORMAL HIGH (ref 6–20)
CO2: 25 mmol/L (ref 22–32)
Calcium: 8.9 mg/dL (ref 8.9–10.3)
Chloride: 99 mmol/L (ref 98–111)
Creatinine, Ser: 1.56 mg/dL — ABNORMAL HIGH (ref 0.61–1.24)
GFR calc Af Amer: >60 mL/min (ref >60)
GFR calc non Af Amer: 55 mL/min — ABNORMAL LOW (ref >60)
Glucose, Bld: 123 mg/dL — ABNORMAL HIGH (ref 70–99)
Potassium: 4.1 mmol/L (ref 3.5–5.1)
Sodium: 135 mmol/L (ref 135–145)
Total Bilirubin: 0.4 mg/dL (ref 0.3–1.2)
Total Protein: 7.6 g/dL (ref 6.5–8.1)

## 2019-11-13 MED ORDER — SODIUM CHLORIDE 0.9 % IV BOLUS
1000.0000 mL | Freq: Once | INTRAVENOUS | Status: AC
  Administered 2019-11-13: 1000 mL via INTRAVENOUS

## 2019-11-13 MED ORDER — ONDANSETRON HCL 4 MG PO TABS: 4.0000 mg | ORAL_TABLET | Freq: Three times a day (TID) | ORAL | 0 refills | Status: DC | PRN

## 2019-11-13 MED ORDER — BENZONATATE 100 MG PO CAPS: 100.0000 mg | ORAL_CAPSULE | Freq: Four times a day (QID) | ORAL | 0 refills | Status: DC | PRN

## 2019-11-13 NOTE — Discharge Instructions (Addendum)
Please seek medical attention for any high fevers, chest pain, shortness of breath, change in behavior, persistent vomiting, bloody stool or any other new or concerning symptoms.  

## 2019-11-13 NOTE — ED Provider Notes (Addendum)
[facility] Emergency Department Provider Note   ____________________________________________   I have reviewed the triage vital signs and the nursing notes.   HISTORY  Chief Complaint Shortness of Breath   History limited by: Not Limited   HPI Robert Dickson is a 39 y.o. male who presents to the emergency department today because of concern for dehydration.  Patient states he was diagnosed with Covid 3 days ago.  He states that he has not had much oral intake.  Everything tastes like dust in the powder.  He is concerned that he might be dehydrated.  Has a very dry mouth.  Additionally patient has some complaints of shortness of breath.  The patient has had a cough.  Has had thick phlegm. No significant vomiting.   Records reviewed. Per medical record review patient has a history of HLD, HTN.   Past Medical History:  Diagnosis Date  . Allergy    allergic rhinitis  . External hemorrhoid   . Hyperlipidemia   . Hypertension     Patient Active Problem List   Diagnosis Date Noted  . Family history of prostate cancer 03/10/2019  . H/O fracture of ankle 08/04/2015  . Routine general medical examination at a health care facility 12/09/2010  . ALLERGIC RHINITIS 04/05/2010  . Hyperlipidemia 11/11/2006  . Essential hypertension 11/11/2006  . MURMUR 11/11/2006    Past Surgical History:  Procedure Laterality Date  . ORIF ANKLE FRACTURE Right 08/05/2015   Procedure: OPEN REDUCTION INTERNAL FIXATION (ORIF) ANKLE FRACTURE;  Surgeon: Kennedy Bucker, MD;  Location: [facility] ORS;  Service: Orthopedics;  Laterality: Right;    Prior to Admission medications   Medication Sig Start Date End Date Taking? Authorizing Provider  albuterol (PROAIR HFA) 108 (90 Base) MCG/ACT inhaler Inhale 2 puffs into the lungs every 6 (six) hours as needed for wheezing or shortness of breath. 10/29/17   Tower, Audrie Gallus, MD  amLODipine (NORVASC) 10 MG tablet Take 1 tablet (10 mg total) by mouth  daily. 03/10/19   Tower, Audrie Gallus, MD  Multiple Vitamin (MULTIVITAMIN) capsule Take 1 capsule by mouth daily.      [provider]  simvastatin (ZOCOR) 20 MG tablet Take 1 tablet (20 mg total) by mouth at bedtime. 03/10/19   Tower, Audrie Gallus, MD    Allergies Lisinopril  Family History  Problem Relation Age of Onset  . Hypertension Father   . Prostate cancer Paternal Grandfather     Social History Social History   Tobacco Use  . Smoking status: Never Smoker  . Smokeless tobacco: Former Engineer, water Use Topics  . Alcohol use: Yes    Alcohol/week: 0.0 standard drinks    Comment: 1-2 drinks/day(a littlem ore in summer)  . Drug use: No    Review of Systems Constitutional: No fever/chills Eyes: No visual changes. ENT: No sore throat. Cardiovascular: Denies chest pain. Respiratory: Denies shortness of breath. Gastrointestinal: No abdominal pain.  No nausea, no vomiting.  No diarrhea.   Genitourinary: Negative for dysuria. Musculoskeletal: Negative for back pain. Skin: Negative for rash. Neurological: Negative for headaches, focal weakness or numbness.  ____________________________________________   PHYSICAL EXAM:  VITAL SIGNS: ED Triage Vitals  Enc Vitals Group     BP 11/13/19 0705 112/75     Pulse Rate 11/13/19 0705 98     Resp 11/13/19 0705 16     Temp 11/13/19 0714 (!) 100.6 F (38.1 C)     Temp Source 11/13/19 0714 Oral  SpO2 11/13/19 0705 92 %     Weight 11/13/19 0701 170 lb (77.1 kg)     Height 11/13/19 0701 5\' 7"  (1.702 m)     Head Circumference --      Peak Flow --      Pain Score 11/13/19 0701 0   Constitutional: Alert and oriented.  Eyes: Conjunctivae are normal.  ENT      Head: Normocephalic and atraumatic.      Nose: No congestion/rhinnorhea.      Mouth/Throat: Mucous membranes are moist.      Neck: No stridor. Hematological/Lymphatic/Immunilogical: No cervical lymphadenopathy. Cardiovascular: Normal rate, regular rhythm.  No murmurs,  rubs, or gallops.  Respiratory: Normal respiratory effort without tachypnea nor retractions. Breath sounds are clear and equal bilaterally. No wheezes/rales/rhonchi. Gastrointestinal: Soft and non tender. No rebound. No guarding.  Genitourinary: Deferred Musculoskeletal: Normal range of motion in all extremities. No lower extremity edema. Neurologic:  Normal speech and language. No gross focal neurologic deficits are appreciated.  Skin:  Skin is warm, dry and intact. No rash noted. Psychiatric: Mood and affect are normal. Speech and behavior are normal. Patient exhibits appropriate insight and judgment.  ____________________________________________    LABS (pertinent positives/negatives)  CBC wbc 6.5, hgb 15.7, plt 284 CMP na 135, k 4.1, glu 123, cr 1.56  ____________________________________________   EKG  I, Nance Pear, attending physician, personally viewed and interpreted this EKG  EKG Time: 0707 Rate: 97 Rhythm: normal sinus rhythm Axis: left axis devation Intervals: qtc 408 QRS: Incomplete RBBB, LVH ST changes: no st elevation Impression: abnormal ekg  ____________________________________________    RADIOLOGY  CXR Bilateral multifocal pneumonia  ____________________________________________   PROCEDURES  Procedures  ____________________________________________   INITIAL IMPRESSION / ASSESSMENT AND PLAN / ED COURSE  Pertinent labs & imaging results that were available during my care of the patient were reviewed by me and considered in my medical decision making (see chart for details).   Patient presented to the emergency department today with concerns for dehydration and symptoms related to Covid.  Patient's creatinine is slightly elevated over his baseline.  I do think he is slightly dehydrated.  Patient was given IV fluids.  Chest x-ray is consistent with Covid.  Will plan on discharging with cough medication and nausea  medication.  ____________________________________________   FINAL CLINICAL IMPRESSION(S) / ED DIAGNOSES  Final diagnoses:  COVID-19  Dehydration     Note: This dictation was prepared with Dragon dictation. Any transcriptional errors that result from this process are unintentional     Nance Pear, MD 11/13/19 [id number]    Nance Pear, MD 11/13/19 [phone]

## 2019-11-13 NOTE — ED Triage Notes (Signed)
Patient reports testing positive for COVID on Thursday, reports shortness of breath since yesterday.  Patient reports unable to eat because his mouth is dry.

## 2020-03-08 ENCOUNTER — Telehealth: Payer: Self-pay | Admitting: Family Medicine

## 2020-03-08 DIAGNOSIS — I10 Essential (primary) hypertension: Secondary | ICD-10-CM

## 2020-03-08 DIAGNOSIS — E7800 Pure hypercholesterolemia, unspecified: Secondary | ICD-10-CM

## 2020-03-08 DIAGNOSIS — Z0000 Encounter for general adult medical examination without abnormal findings: Secondary | ICD-10-CM

## 2020-03-08 NOTE — Telephone Encounter (Signed)
-----   Message from Aquilla Solian, RT sent at 02/23/2020  1:33 PM EDT ----- Regarding: Lab Orders for Friday 8.13.2021 Please place lab orders for Friday 8.13.2021, office visit for physical on Tuesday 8.17.2021 Thank you, Jones Bales RT(R)

## 2020-03-09 ENCOUNTER — Other Ambulatory Visit: Payer: Self-pay

## 2020-03-09 ENCOUNTER — Other Ambulatory Visit (INDEPENDENT_AMBULATORY_CARE_PROVIDER_SITE_OTHER): Payer: Commercial Managed Care - PPO

## 2020-03-09 DIAGNOSIS — E7800 Pure hypercholesterolemia, unspecified: Secondary | ICD-10-CM

## 2020-03-09 DIAGNOSIS — I10 Essential (primary) hypertension: Secondary | ICD-10-CM | POA: Diagnosis not present

## 2020-03-09 LAB — LIPID PANEL
Cholesterol: 247 mg/dL — ABNORMAL HIGH (ref 0–200)
HDL: 73.9 mg/dL (ref >39.00)
NonHDL: 173.06
Total CHOL/HDL Ratio: 3
Triglycerides: 226 mg/dL — ABNORMAL HIGH (ref 0.0–149.0)
VLDL: 45.2 mg/dL — ABNORMAL HIGH (ref 0.0–40.0)

## 2020-03-09 LAB — CBC WITH DIFFERENTIAL/PLATELET
Basophils Absolute: 0 10*3/uL (ref 0.0–0.1)
Basophils Relative: 0.4 % (ref 0.0–3.0)
Eosinophils Absolute: 0.5 10*3/uL (ref 0.0–0.7)
Eosinophils Relative: 7.8 % — ABNORMAL HIGH (ref 0.0–5.0)
HCT: 46.5 % (ref 39.0–52.0)
Hemoglobin: 15.6 g/dL (ref 13.0–17.0)
Lymphocytes Relative: 33.1 % (ref 12.0–46.0)
Lymphs Abs: 2.3 10*3/uL (ref 0.7–4.0)
MCHC: 33.6 g/dL (ref 30.0–36.0)
MCV: 86.8 fl (ref 78.0–100.0)
Monocytes Absolute: 0.7 10*3/uL (ref 0.1–1.0)
Monocytes Relative: 9.5 % (ref 3.0–12.0)
Neutro Abs: 3.5 10*3/uL (ref 1.4–7.7)
Neutrophils Relative %: 49.2 % (ref 43.0–77.0)
Platelets: 306 10*3/uL (ref 150.0–400.0)
RBC: 5.36 Mil/uL (ref 4.22–5.81)
RDW: 13.3 % (ref 11.5–15.5)
WBC: 7 10*3/uL (ref 4.0–10.5)

## 2020-03-09 LAB — COMPREHENSIVE METABOLIC PANEL
ALT: 22 U/L (ref 0–53)
AST: 21 U/L (ref 0–37)
Albumin: 4.7 g/dL (ref 3.5–5.2)
Alkaline Phosphatase: 78 U/L (ref 39–117)
BUN: 12 mg/dL (ref 6–23)
CO2: 28 mEq/L (ref 19–32)
Calcium: 9.9 mg/dL (ref 8.4–10.5)
Chloride: 101 mEq/L (ref 96–112)
Creatinine, Ser: 1.28 mg/dL (ref 0.40–1.50)
GFR: 61.99 mL/min (ref >60.00)
Glucose, Bld: 91 mg/dL (ref 70–99)
Potassium: 4.8 mEq/L (ref 3.5–5.1)
Sodium: 138 mEq/L (ref 135–145)
Total Bilirubin: 0.3 mg/dL (ref 0.2–1.2)
Total Protein: 7.2 g/dL (ref 6.0–8.3)

## 2020-03-09 LAB — LDL CHOLESTEROL, DIRECT: Direct LDL: 134 mg/dL

## 2020-03-09 LAB — TSH: TSH: 2.83 u[IU]/mL (ref 0.35–4.50)

## 2020-03-13 ENCOUNTER — Other Ambulatory Visit: Payer: Self-pay

## 2020-03-13 ENCOUNTER — Encounter: Payer: Self-pay | Admitting: Family Medicine

## 2020-03-13 ENCOUNTER — Ambulatory Visit (INDEPENDENT_AMBULATORY_CARE_PROVIDER_SITE_OTHER): Payer: Commercial Managed Care - PPO | Admitting: Family Medicine

## 2020-03-13 VITALS — BP 140/85 | HR 77 | Temp 97.4°F | Ht 67.75 in | Wt 171.4 lb

## 2020-03-13 DIAGNOSIS — Z8042 Family history of malignant neoplasm of prostate: Secondary | ICD-10-CM | POA: Diagnosis not present

## 2020-03-13 DIAGNOSIS — E7800 Pure hypercholesterolemia, unspecified: Secondary | ICD-10-CM | POA: Diagnosis not present

## 2020-03-13 DIAGNOSIS — Z0000 Encounter for general adult medical examination without abnormal findings: Secondary | ICD-10-CM | POA: Diagnosis not present

## 2020-03-13 DIAGNOSIS — I10 Essential (primary) hypertension: Secondary | ICD-10-CM

## 2020-03-13 MED ORDER — SIMVASTATIN 20 MG PO TABS: 20.0000 mg | ORAL_TABLET | Freq: Every day | ORAL | 3 refills | Status: DC

## 2020-03-13 MED ORDER — AMLODIPINE BESYLATE 10 MG PO TABS: 10.0000 mg | ORAL_TABLET | Freq: Every day | ORAL | 3 refills | Status: DC

## 2020-03-13 NOTE — Assessment & Plan Note (Signed)
Continues simvastatin however missed multiple doses right before draw on vacation and also ate very poorly so LDL and trig are up  Disc goals for lipids and reasons to control them Rev last labs with pt Rev low sat fat diet in detail

## 2020-03-13 NOTE — Patient Instructions (Addendum)
If you have questions about the covid vaccine   COVID-19 Vaccine Information can be found at: [email or web address] For questions related to vaccine distribution or appointments, please email [email or web address] or call [phone].   Get a flu shot in the fall   Check your blood pressure at home (when relaxed ) - 3-4 readings per week for the next 2 weeks and then call us with readings or put them in the mail If trending up we may have to add medicine   Try to get back with regular exercise as well

## 2020-03-13 NOTE — Progress Notes (Signed)
Subjective:    Patient ID: Robert Dickson, male    DOB: 06-Jul-1981, 39 y.o.   MRN: [id number]  This visit occurred during the SARS-CoV-2 public health emergency.  Safety protocols were in place, including screening questions prior to the visit, additional usage of staff PPE, and extensive cleaning of exam room while observing appropriate contact time as indicated for disinfecting solutions.    HPI Here for health maintenance exam and to review chronic medical problems    Wt Readings from Last 3 Encounters:  03/13/20 171 lb 7 oz (77.8 kg)  11/13/19 170 lb (77.1 kg)  03/10/19 172 lb 6 oz (78.2 kg)   26.26 kg/m  Working a lot  Takes fair care of himself  Feeling ok   Eating fairly healthy- he cut some fast food out   covid status -he had covid in April  No longer has symptoms  Had antibody test - and he still had antibody   Not ready to get the vaccine yet    Flu shot-gets at work  Tdap 5/12  Prostate health Occasional nocturia-not often  No problems emptying bladder    HTN bp is stable today  No cp or palpitations or headaches or edema  No side effects to medicines  BP Readings from Last 3 Encounters:  03/13/20 (!) 152/90  11/13/19 110/74  03/10/19 138/80    Takes amlodipine 10 mg  Has not taken his medicine yet - he takes it in the afternoons  Has not missed a dose     Hyperlipidemia  Lab Results  Component Value Date   CHOL 247 (H) 03/09/2020   CHOL 206 (H) 03/07/2019   CHOL 184 12/07/2017   Lab Results  Component Value Date   HDL 73.90 03/09/2020   HDL 64.90 03/07/2019   HDL 74.80 12/07/2017   Lab Results  Component Value Date   LDLCALC 112 (H) 03/07/2019   LDLCALC 88 12/05/2016   LDLCALC 112 (H) 06/27/2014   Lab Results  Component Value Date   TRIG 226.0 (H) 03/09/2020   TRIG 148.0 03/07/2019   TRIG 249.0 (H) 12/07/2017   Lab Results  Component Value Date   CHOLHDL 3 03/09/2020   CHOLHDL 3 03/07/2019   CHOLHDL 2 12/07/2017   Lab  Results  Component Value Date   LDLDIRECT 134.0 03/09/2020   LDLDIRECT 87.0 12/07/2017   LDLDIRECT 115.0 11/29/2015   Simvastatin 20 mg daily  Was on fishing trip the week before his draw  Did not take his medicine Was eating red meat/ junk food and alcohol   Back to regular diet now   Lab Results  Component Value Date   WBC 7.0 03/09/2020   HGB 15.6 03/09/2020   HCT 46.5 03/09/2020   MCV 86.8 03/09/2020   PLT 306.0 03/09/2020   Lab Results  Component Value Date   TSH 2.83 03/09/2020   Lab Results  Component Value Date   CREATININE 1.28 03/09/2020   BUN 12 03/09/2020   NA 138 03/09/2020   K 4.8 03/09/2020   CL 101 03/09/2020   CO2 28 03/09/2020   Lab Results  Component Value Date   ALT 22 03/09/2020   AST 21 03/09/2020   ALKPHOS 78 03/09/2020   BILITOT 0.3 03/09/2020     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 Family  history of prostate cancer 03/10/2019  . H/O fracture of ankle 08/04/2015  . Routine general medical examination at a health care facility 12/09/2010  . ALLERGIC RHINITIS 04/05/2010  . Hyperlipidemia 11/11/2006  . Essential hypertension 11/11/2006  . MURMUR 11/11/2006   Past Medical History:  Diagnosis Date  . Allergy    allergic rhinitis  . External hemorrhoid   . Hyperlipidemia   . Hypertension    Past Surgical History:  Procedure Laterality Date  . ORIF ANKLE FRACTURE Right 08/05/2015   Procedure: OPEN REDUCTION INTERNAL FIXATION (ORIF) ANKLE FRACTURE;  Surgeon: Kennedy Bucker, MD;  Location: [facility] ORS;  Service: Orthopedics;  Laterality: Right;   Social History   Tobacco Use  . Smoking status: Never Smoker  . Smokeless tobacco: Former Engineer, water Use Topics  . Alcohol use: Yes    Alcohol/week: 0.0 standard drinks    Comment: 1-2 drinks/day(a littlem ore in summer)  . Drug use: No   Family History  Problem  Relation Age of Onset  . Hypertension Father   . Prostate cancer Paternal Grandfather    Allergies  Allergen Reactions  . Lisinopril Cough    ? cough   Current Outpatient Medications on File Prior to Visit  Medication Sig Dispense Refill  . albuterol (PROAIR HFA) 108 (90 Base) MCG/ACT inhaler Inhale 2 puffs into the lungs every 6 (six) hours as needed for wheezing or shortness of breath. 1 Inhaler 5  . Multiple Vitamin (MULTIVITAMIN) capsule Take 1 capsule by mouth daily.       No current facility-administered medications on file prior to visit.    Review of Systems  Constitutional: Negative for activity change, appetite change, fatigue, fever and unexpected weight change.  HENT: Negative for congestion, rhinorrhea, sore throat and trouble swallowing.   Eyes: Negative for pain, redness, itching and visual disturbance.  Respiratory: Negative for cough, chest tightness, shortness of breath and wheezing.   Cardiovascular: Negative for chest pain and palpitations.  Gastrointestinal: Negative for abdominal pain, blood in stool, constipation, diarrhea and nausea.  Endocrine: Negative for cold intolerance, heat intolerance, polydipsia and polyuria.  Genitourinary: Negative for difficulty urinating, dysuria, frequency and urgency.  Musculoskeletal: Negative for arthralgias, joint swelling and myalgias.  Skin: Negative for pallor and rash.  Neurological: Negative for dizziness, tremors, weakness, numbness and headaches.  Hematological: Negative for adenopathy. Does not bruise/bleed easily.  Psychiatric/Behavioral: Negative for decreased concentration and dysphoric mood. The patient is not nervous/anxious.        Objective:   Physical Exam Constitutional:      General: He is not in acute distress.    Appearance: Normal appearance. He is well-developed and normal weight. He is not ill-appearing or diaphoretic.  HENT:     Head: Normocephalic and atraumatic.     Right Ear: Tympanic  membrane, ear canal and external ear normal.     Left Ear: Tympanic membrane, ear canal and external ear normal.     Nose: Nose normal. No congestion.     Mouth/Throat:     Mouth: Mucous membranes are moist.     Pharynx: Oropharynx is clear. No posterior oropharyngeal erythema.  Eyes:     General: No scleral icterus.       Right eye: No discharge.        Left eye: No discharge.     Conjunctiva/sclera: Conjunctivae normal.     Pupils: Pupils are equal, round, and reactive to light.  Neck:     Thyroid: No thyromegaly.  Vascular: No carotid bruit or JVD.  Cardiovascular:     Rate and Rhythm: Normal rate and regular rhythm.     Pulses: Normal pulses.     Heart sounds: Normal heart sounds. No gallop.   Pulmonary:     Effort: Pulmonary effort is normal. No respiratory distress.     Breath sounds: Normal breath sounds. No wheezing or rales.     Comments: Good air exch Chest:     Chest wall: No tenderness.  Abdominal:     General: Bowel sounds are normal. There is no distension or abdominal bruit.     Palpations: Abdomen is soft. There is no mass.     Tenderness: There is no abdominal tenderness.     Hernia: No hernia is present.  Musculoskeletal:        General: No tenderness.     Cervical back: Normal range of motion and neck supple. No rigidity. No muscular tenderness.     Right lower leg: No edema.     Left lower leg: No edema.     Comments: No acute joint changes   Lymphadenopathy:     Cervical: No cervical adenopathy.  Skin:    General: Skin is warm and dry.     Coloration: Skin is not pale.     Findings: No erythema or rash.     Comments: Solar lentigines diffusely   Neurological:     Mental Status: He is alert.     Cranial Nerves: No cranial nerve deficit.     Motor: No abnormal muscle tone.     Coordination: Coordination normal.     Gait: Gait normal.     Deep Tendon Reflexes: Reflexes are normal and symmetric. Reflexes normal.  Psychiatric:        Mood and  Affect: Mood normal.        Cognition and Memory: Cognition and memory normal.           Assessment & Plan:   Problem List Items Addressed This Visit      Cardiovascular and Mediastinum   Essential hypertension    bp in fair control at this time  BP Readings from Last 1 Encounters:  03/13/20 140/85   No changes needed now but asked to monitor at home and report back in 2 weeks  If high-may need to add therapy Most recent labs reviewed  Disc lifstyle change with low sodium diet and exercise        Relevant Medications   amLODipine (NORVASC) 10 MG tablet   simvastatin (ZOCOR) 20 MG tablet     Other   Hyperlipidemia    Continues simvastatin however missed multiple doses right before draw on vacation and also ate very poorly so LDL and trig are up  Disc goals for lipids and reasons to control them Rev last labs with pt Rev low sat fat diet in detail       Relevant Medications   amLODipine (NORVASC) 10 MG tablet   simvastatin (ZOCOR) 20 MG tablet   Routine general medical examination at a health care facility - Primary    Reviewed health habits including diet and exercise and skin cancer prevention Reviewed appropriate screening tests for age  Also reviewed health mt list, fam hx and immunization status , as well as social and family history   See HPI Labs reviewed (noted inc cholesterol)  bp up slightly -will watch at home Strongly enc covid vaccination-pt is resistant  Plans flu shot at work this fall  Diet is improved -less fast food and plans to start more exercise        Family history of prostate cancer    No urinary changes  occ nocturia with extra fluids before bed

## 2020-03-13 NOTE — Assessment & Plan Note (Signed)
bp in fair control at this time  BP Readings from Last 1 Encounters:  03/13/20 140/85   No changes needed now but asked to monitor at home and report back in 2 weeks  If high-may need to add therapy Most recent labs reviewed  Disc lifstyle change with low sodium diet and exercise

## 2020-03-13 NOTE — Assessment & Plan Note (Signed)
No urinary changes  occ nocturia with extra fluids before bed

## 2020-03-13 NOTE — Assessment & Plan Note (Signed)
Reviewed health habits including diet and exercise and skin cancer prevention Reviewed appropriate screening tests for age  Also reviewed health mt list, fam hx and immunization status , as well as social and family history   See HPI Labs reviewed (noted inc cholesterol)  bp up slightly -will watch at home Strongly enc covid vaccination-pt is resistant  Plans flu shot at work this fall  Diet is improved -less fast food and plans to start more exercise

## 2021-03-13 ENCOUNTER — Telehealth: Payer: Self-pay | Admitting: Family Medicine

## 2021-03-13 DIAGNOSIS — Z0000 Encounter for general adult medical examination without abnormal findings: Secondary | ICD-10-CM

## 2021-03-13 DIAGNOSIS — I10 Essential (primary) hypertension: Secondary | ICD-10-CM

## 2021-03-13 DIAGNOSIS — E7800 Pure hypercholesterolemia, unspecified: Secondary | ICD-10-CM

## 2021-03-13 NOTE — Telephone Encounter (Signed)
-----   Message from Alvina Chou sent at 02/25/2021 10:10 AM EDT ----- Regarding: Lab orders for Thursday, 8.18.22 Patient is scheduled for CPX labs, please order future labs, Thanks , Camelia Eng

## 2021-03-14 ENCOUNTER — Other Ambulatory Visit (INDEPENDENT_AMBULATORY_CARE_PROVIDER_SITE_OTHER): Payer: Commercial Managed Care - PPO

## 2021-03-14 ENCOUNTER — Ambulatory Visit (INDEPENDENT_AMBULATORY_CARE_PROVIDER_SITE_OTHER): Payer: Commercial Managed Care - PPO | Admitting: Family Medicine

## 2021-03-14 ENCOUNTER — Encounter: Payer: Self-pay | Admitting: Family Medicine

## 2021-03-14 VITALS — BP 143/88 | HR 75 | Temp 98.1°F | Ht 67.75 in | Wt 170.2 lb

## 2021-03-14 DIAGNOSIS — Z0000 Encounter for general adult medical examination without abnormal findings: Secondary | ICD-10-CM | POA: Diagnosis not present

## 2021-03-14 DIAGNOSIS — E7800 Pure hypercholesterolemia, unspecified: Secondary | ICD-10-CM

## 2021-03-14 DIAGNOSIS — I10 Essential (primary) hypertension: Secondary | ICD-10-CM

## 2021-03-14 LAB — LIPID PANEL
Cholesterol: 217 mg/dL — ABNORMAL HIGH (ref 0–200)
HDL: 68.1 mg/dL (ref >39.00)
NonHDL: 149.25
Total CHOL/HDL Ratio: 3
Triglycerides: 241 mg/dL — ABNORMAL HIGH (ref 0.0–149.0)
VLDL: 48.2 mg/dL — ABNORMAL HIGH (ref 0.0–40.0)

## 2021-03-14 LAB — CBC WITH DIFFERENTIAL/PLATELET
Basophils Absolute: 0 10*3/uL (ref 0.0–0.1)
Basophils Relative: 0.6 % (ref 0.0–3.0)
Eosinophils Absolute: 0.5 10*3/uL (ref 0.0–0.7)
Eosinophils Relative: 7.5 % — ABNORMAL HIGH (ref 0.0–5.0)
HCT: 45.6 % (ref 39.0–52.0)
Hemoglobin: 15.5 g/dL (ref 13.0–17.0)
Lymphocytes Relative: 31.6 % (ref 12.0–46.0)
Lymphs Abs: 2.1 10*3/uL (ref 0.7–4.0)
MCHC: 34 g/dL (ref 30.0–36.0)
MCV: 86.2 fl (ref 78.0–100.0)
Monocytes Absolute: 0.7 10*3/uL (ref 0.1–1.0)
Monocytes Relative: 10.4 % (ref 3.0–12.0)
Neutro Abs: 3.4 10*3/uL (ref 1.4–7.7)
Neutrophils Relative %: 49.9 % (ref 43.0–77.0)
Platelets: 316 10*3/uL (ref 150.0–400.0)
RBC: 5.29 Mil/uL (ref 4.22–5.81)
RDW: 13.3 % (ref 11.5–15.5)
WBC: 6.8 10*3/uL (ref 4.0–10.5)

## 2021-03-14 LAB — COMPREHENSIVE METABOLIC PANEL
ALT: 26 U/L (ref 0–53)
AST: 24 U/L (ref 0–37)
Albumin: 4.5 g/dL (ref 3.5–5.2)
Alkaline Phosphatase: 88 U/L (ref 39–117)
BUN: 11 mg/dL (ref 6–23)
CO2: 28 mEq/L (ref 19–32)
Calcium: 10.2 mg/dL (ref 8.4–10.5)
Chloride: 99 mEq/L (ref 96–112)
Creatinine, Ser: 1.3 mg/dL (ref 0.40–1.50)
GFR: 68.08 mL/min (ref >60.00)
Glucose, Bld: 88 mg/dL (ref 70–99)
Potassium: 4.4 mEq/L (ref 3.5–5.1)
Sodium: 137 mEq/L (ref 135–145)
Total Bilirubin: 0.5 mg/dL (ref 0.2–1.2)
Total Protein: 7.2 g/dL (ref 6.0–8.3)

## 2021-03-14 LAB — TSH: TSH: 3.71 u[IU]/mL (ref 0.35–5.50)

## 2021-03-14 LAB — LDL CHOLESTEROL, DIRECT: Direct LDL: 126 mg/dL

## 2021-03-14 MED ORDER — ALBUTEROL SULFATE HFA 108 (90 BASE) MCG/ACT IN AERS: 2.0000 | INHALATION_SPRAY | Freq: Four times a day (QID) | RESPIRATORY_TRACT | 5 refills | Status: DC | PRN

## 2021-03-14 MED ORDER — SIMVASTATIN 20 MG PO TABS: 20.0000 mg | ORAL_TABLET | Freq: Every day | ORAL | 3 refills | Status: DC

## 2021-03-14 MED ORDER — AMLODIPINE BESYLATE 10 MG PO TABS: 10.0000 mg | ORAL_TABLET | Freq: Every day | ORAL | 3 refills | Status: DC

## 2021-03-14 NOTE — Assessment & Plan Note (Signed)
Reviewed health habits including diet and exercise and skin cancer prevention Reviewed appropriate screening tests for age  Also reviewed health mt list, fam hx and immunization status , as well as social and family history   See HPI Labs ordered  Not immunized for covid  Planning flu shot in the fall  No prostate issues  Will discuss colon cancer screening at 43 Encouraged healthy diet and exercise

## 2021-03-14 NOTE — Progress Notes (Signed)
Subjective:    Patient ID: Robert Dickson, male    DOB: 1980/09/20, 40 y.o.   MRN: [id number]  This visit occurred during the SARS-CoV-2 public health emergency.  Safety protocols were in place, including screening questions prior to the visit, additional usage of staff PPE, and extensive cleaning of exam room while observing appropriate contact time as indicated for disinfecting solutions.   HPI Here for health maintenance exam and to review chronic medical problems    Pt had labs drawn this am already  Wt Readings from Last 3 Encounters:  03/14/21 170 lb 4 oz (77.2 kg)  03/13/20 171 lb 7 oz (77.8 kg)  11/13/19 170 lb (77.1 kg)   26.08 kg/m  Going to get hardware out from old ankle fracture- some more discomfort lately  Taking care of himself    Working a lot  Just came home from the beach  Physical job    Tdap 5/12  Covid status - had is once  Flu shot - will get at work this fall    Prostate health  PGF had prostate cancer  No trouble with bladder emptying   No nocturia  No changes in stream     bp is stable today  No cp or palpitations or headaches or edema  No side effects to medicines  BP Readings from Last 3 Encounters:  03/14/21 (!) 143/88  03/13/20 140/85  11/13/19 110/74    Takes amlodipine 10 mg daily   Has not taken bp today   140s/80 at home    Hyperlipidemia Lab Results  Component Value Date   CHOL 247 (H) 03/09/2020   HDL 73.90 03/09/2020   LDLCALC 112 (H) 03/07/2019   LDLDIRECT 134.0 03/09/2020   TRIG 226.0 (H) 03/09/2020   CHOLHDL 3 03/09/2020   Had labs this am-pending Taking simvastatin 20 mg daily   No red meat 8-9 days Making effort to eat less  More chicken and fish    Patient Active Problem List   Diagnosis Date Noted   Family history of prostate cancer 03/10/2019   H/O fracture of ankle 08/04/2015   Routine general medical examination at a health care facility 12/09/2010   ALLERGIC RHINITIS 04/05/2010    Hyperlipidemia 11/11/2006   Essential hypertension 11/11/2006   MURMUR 11/11/2006   Past Medical History:  Diagnosis Date   Allergy    allergic rhinitis   External hemorrhoid    Hyperlipidemia    Hypertension    Past Surgical History:  Procedure Laterality Date   ORIF ANKLE FRACTURE Right 08/05/2015   Procedure: OPEN REDUCTION INTERNAL FIXATION (ORIF) ANKLE FRACTURE;  Surgeon: Kennedy Bucker, MD;  Location: [facility] ORS;  Service: Orthopedics;  Laterality: Right;   Social History   Tobacco Use   Smoking status: Never   Smokeless tobacco: Former  Substance Use Topics   Alcohol use: Yes    Alcohol/week: 0.0 standard drinks    Comment: 1-2 drinks/day(a littlem ore in summer)   Drug use: No   Family History  Problem Relation Age of Onset   Hypertension Father    Prostate cancer Paternal Grandfather    Allergies  Allergen Reactions   Lisinopril Cough    ? cough   Current Outpatient Medications on File Prior to Visit  Medication Sig Dispense Refill   Multiple Vitamin (MULTIVITAMIN) capsule Take 1 capsule by mouth daily.       No current facility-administered medications on file prior to visit.      Review of Systems  Constitutional:  Negative for activity change, appetite change, fatigue, fever and unexpected weight change.  HENT:  Negative for congestion, rhinorrhea, sore throat and trouble swallowing.   Eyes:  Negative for pain, redness, itching and visual disturbance.  Respiratory:  Negative for cough, chest tightness, shortness of breath and wheezing.   Cardiovascular:  Negative for chest pain and palpitations.  Gastrointestinal:  Negative for abdominal pain, blood in stool, constipation, diarrhea and nausea.  Endocrine: Negative for cold intolerance, heat intolerance, polydipsia and polyuria.  Genitourinary:  Negative for difficulty urinating, dysuria, frequency and urgency.  Musculoskeletal:  Negative for arthralgias, joint swelling and myalgias.       Ankle pain from  previous fracture   Skin:  Negative for pallor and rash.  Neurological:  Negative for dizziness, tremors, weakness, numbness and headaches.  Hematological:  Negative for adenopathy. Does not bruise/bleed easily.  Psychiatric/Behavioral:  Negative for decreased concentration and dysphoric mood. The patient is not nervous/anxious.       Objective:   Physical Exam Constitutional:      General: He is not in acute distress.    Appearance: Normal appearance. He is well-developed and normal weight. He is not ill-appearing or diaphoretic.  HENT:     Head: Normocephalic and atraumatic.     Right Ear: Tympanic membrane, ear canal and external ear normal.     Left Ear: Tympanic membrane, ear canal and external ear normal.     Nose: Nose normal. No congestion.     Mouth/Throat:     Mouth: Mucous membranes are moist.     Pharynx: Oropharynx is clear. No posterior oropharyngeal erythema.  Eyes:     General: No scleral icterus.       Right eye: No discharge.        Left eye: No discharge.     Conjunctiva/sclera: Conjunctivae normal.     Pupils: Pupils are equal, round, and reactive to light.  Neck:     Thyroid: No thyromegaly.     Vascular: No carotid bruit or JVD.  Cardiovascular:     Rate and Rhythm: Normal rate and regular rhythm.     Pulses: Normal pulses.     Heart sounds: Normal heart sounds.    No gallop.  Pulmonary:     Effort: Pulmonary effort is normal. No respiratory distress.     Breath sounds: Normal breath sounds. No wheezing or rales.     Comments: Good air exch Chest:     Chest wall: No tenderness.  Abdominal:     General: Bowel sounds are normal. There is no distension or abdominal bruit.     Palpations: Abdomen is soft. There is no mass.     Tenderness: There is no abdominal tenderness.     Hernia: No hernia is present.  Musculoskeletal:        General: No tenderness.     Cervical back: Normal range of motion and neck supple. No rigidity. No muscular tenderness.      Right lower leg: No edema.     Left lower leg: No edema.     Comments: Surgical changes R ankle  Lymphadenopathy:     Cervical: No cervical adenopathy.  Skin:    General: Skin is warm and dry.     Coloration: Skin is not pale.     Findings: No erythema or rash.     Comments: Solar lentigines diffusely   Neurological:     Mental Status: He is alert.     Cranial Nerves:  No cranial nerve deficit.     Motor: No abnormal muscle tone.     Coordination: Coordination normal.     Gait: Gait normal.     Deep Tendon Reflexes: Reflexes are normal and symmetric. Reflexes normal.  Psychiatric:        Mood and Affect: Mood normal.        Cognition and Memory: Cognition and memory normal.     Comments: Pleasant           Assessment & Plan:   Problem List Items Addressed This Visit       Cardiovascular and Mediastinum   Essential hypertension    BP is elevated but pt has not taken his medication today  BP: (!) 143/88    Plan to continue amlodipine 10 mg daily  Watch at home  F/u in 2-3 months       Relevant Medications   amLODipine (NORVASC) 10 MG tablet   simvastatin (ZOCOR) 20 MG tablet     Other   Hyperlipidemia    Disc goals for lipids and reasons to control them Rev last labs with pt Rev low sat fat diet in detail  Labs today  Plan to continue simvastatin 20 mg daily        Relevant Medications   amLODipine (NORVASC) 10 MG tablet   simvastatin (ZOCOR) 20 MG tablet   Routine general medical examination at a health care facility - Primary    Reviewed health habits including diet and exercise and skin cancer prevention Reviewed appropriate screening tests for age  Also reviewed health mt list, fam hx and immunization status , as well as social and family history   See HPI Labs ordered  Not immunized for covid  Planning flu shot in the fall  No prostate issues  Will discuss colon cancer screening at 43 Encouraged healthy diet and exercise

## 2021-03-14 NOTE — Assessment & Plan Note (Signed)
BP is elevated but pt has not taken his medication today  BP: (!) 143/88    Plan to continue amlodipine 10 mg daily  Watch at home  F/u in 2-3 months

## 2021-03-14 NOTE — Patient Instructions (Signed)
Take care of yourself   Avoid excess processed foods for blood pressure  Follow up in 2-3 months with your blood pressure cuff  If still elevated we will add medication   Drink lots of water  Stay active

## 2021-03-14 NOTE — Assessment & Plan Note (Signed)
Disc goals for lipids and reasons to control them Rev last labs with pt Rev low sat fat diet in detail  Labs today  Plan to continue simvastatin 20 mg daily

## 2021-03-18 ENCOUNTER — Encounter: Payer: Self-pay | Admitting: *Deleted

## 2021-06-03 ENCOUNTER — Ambulatory Visit: Payer: Commercial Managed Care - PPO | Admitting: Family Medicine

## 2021-10-08 IMAGING — DX DG CHEST 1V PORT
1 series · 1 of 1 positions shown · non-contrast
Comparison: None.

CLINICAL DATA: Shortness of breath. GVRMI-MY positive.

EXAM:
PORTABLE CHEST 1 VIEW

[chest ap]
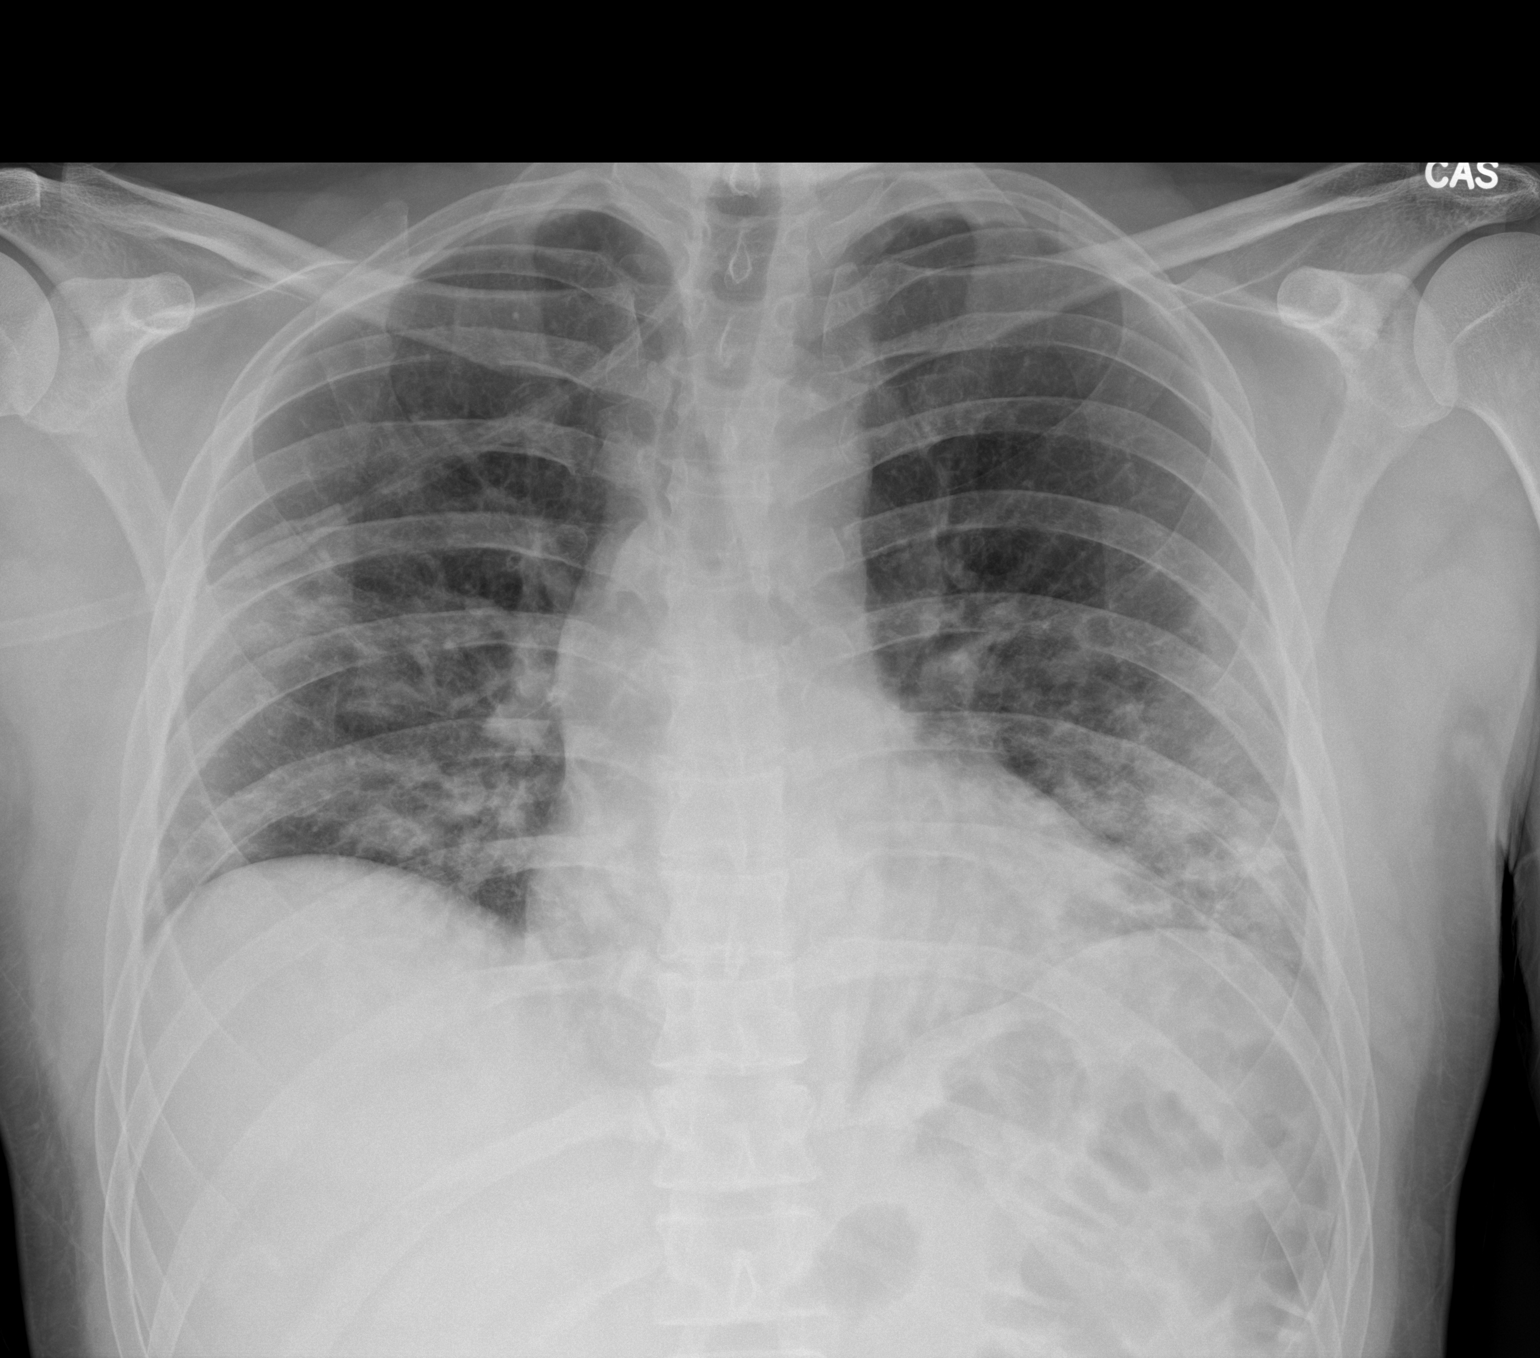

[1 of 1 positions shown; findings below may reference images not displayed]

FINDINGS: The heart size and mediastinal contours are within normal limits. No
pneumothorax or pleural effusion is noted. Multiple airspace
opacities are noted bilaterally consistent with multifocal
pneumonia. The visualized skeletal structures are unremarkable.
IMPRESSION: Bilateral multifocal pneumonia.

## 2021-10-31 ENCOUNTER — Other Ambulatory Visit: Payer: Self-pay | Admitting: Family Medicine

## 2022-03-25 ENCOUNTER — Telehealth: Payer: Self-pay | Admitting: Family Medicine

## 2022-03-25 DIAGNOSIS — E7800 Pure hypercholesterolemia, unspecified: Secondary | ICD-10-CM

## 2022-03-25 DIAGNOSIS — I10 Essential (primary) hypertension: Secondary | ICD-10-CM

## 2022-03-25 DIAGNOSIS — Z0000 Encounter for general adult medical examination without abnormal findings: Secondary | ICD-10-CM

## 2022-03-25 NOTE — Telephone Encounter (Signed)
-----   Message from Terri J Walsh sent at 03/17/2022 12:14 PM EDT ----- Regarding: Lab orders for Wednesday, 8.30.23 Patient is scheduled for CPX labs, please order future labs, Thanks , Terri   

## 2022-03-26 ENCOUNTER — Other Ambulatory Visit (INDEPENDENT_AMBULATORY_CARE_PROVIDER_SITE_OTHER): Payer: Commercial Managed Care - PPO

## 2022-03-26 DIAGNOSIS — I10 Essential (primary) hypertension: Secondary | ICD-10-CM | POA: Diagnosis not present

## 2022-03-26 DIAGNOSIS — E7800 Pure hypercholesterolemia, unspecified: Secondary | ICD-10-CM

## 2022-03-26 LAB — CBC WITH DIFFERENTIAL/PLATELET
Basophils Absolute: 0 10*3/uL (ref 0.0–0.1)
Basophils Relative: 0.6 % (ref 0.0–3.0)
Eosinophils Absolute: 0.4 10*3/uL (ref 0.0–0.7)
Eosinophils Relative: 6.4 % — ABNORMAL HIGH (ref 0.0–5.0)
HCT: 45.5 % (ref 39.0–52.0)
Hemoglobin: 15.1 g/dL (ref 13.0–17.0)
Lymphocytes Relative: 30.5 % (ref 12.0–46.0)
Lymphs Abs: 2 10*3/uL (ref 0.7–4.0)
MCHC: 33.1 g/dL (ref 30.0–36.0)
MCV: 87.4 fl (ref 78.0–100.0)
Monocytes Absolute: 0.6 10*3/uL (ref 0.1–1.0)
Monocytes Relative: 10.1 % (ref 3.0–12.0)
Neutro Abs: 3.4 10*3/uL (ref 1.4–7.7)
Neutrophils Relative %: 52.4 % (ref 43.0–77.0)
Platelets: 307 10*3/uL (ref 150.0–400.0)
RBC: 5.21 Mil/uL (ref 4.22–5.81)
RDW: 13.6 % (ref 11.5–15.5)
WBC: 6.4 10*3/uL (ref 4.0–10.5)

## 2022-03-26 LAB — LIPID PANEL
Cholesterol: 207 mg/dL — ABNORMAL HIGH (ref 0–200)
HDL: 72.4 mg/dL (ref >39.00)
NonHDL: 134.33
Total CHOL/HDL Ratio: 3
Triglycerides: 207 mg/dL — ABNORMAL HIGH (ref 0.0–149.0)
VLDL: 41.4 mg/dL — ABNORMAL HIGH (ref 0.0–40.0)

## 2022-03-26 LAB — COMPREHENSIVE METABOLIC PANEL
ALT: 32 U/L (ref 0–53)
AST: 27 U/L (ref 0–37)
Albumin: 4.6 g/dL (ref 3.5–5.2)
Alkaline Phosphatase: 83 U/L (ref 39–117)
BUN: 10 mg/dL (ref 6–23)
CO2: 27 mEq/L (ref 19–32)
Calcium: 9.9 mg/dL (ref 8.4–10.5)
Chloride: 101 mEq/L (ref 96–112)
Creatinine, Ser: 1.24 mg/dL (ref 0.40–1.50)
GFR: 71.53 mL/min (ref >60.00)
Glucose, Bld: 88 mg/dL (ref 70–99)
Potassium: 4.8 mEq/L (ref 3.5–5.1)
Sodium: 137 mEq/L (ref 135–145)
Total Bilirubin: 0.3 mg/dL (ref 0.2–1.2)
Total Protein: 7.3 g/dL (ref 6.0–8.3)

## 2022-03-26 LAB — LDL CHOLESTEROL, DIRECT: Direct LDL: 101 mg/dL

## 2022-03-26 LAB — TSH: TSH: 3 u[IU]/mL (ref 0.35–5.50)

## 2022-03-31 ENCOUNTER — Other Ambulatory Visit: Payer: Self-pay | Admitting: Family Medicine

## 2022-04-02 ENCOUNTER — Encounter: Payer: Self-pay | Admitting: Family Medicine

## 2022-04-02 ENCOUNTER — Ambulatory Visit (INDEPENDENT_AMBULATORY_CARE_PROVIDER_SITE_OTHER): Payer: Commercial Managed Care - PPO | Admitting: Family Medicine

## 2022-04-02 VITALS — BP 146/90 | HR 90 | Temp 97.9°F | Ht 67.5 in | Wt 170.2 lb

## 2022-04-02 DIAGNOSIS — Z23 Encounter for immunization: Secondary | ICD-10-CM | POA: Diagnosis not present

## 2022-04-02 DIAGNOSIS — Z0000 Encounter for general adult medical examination without abnormal findings: Secondary | ICD-10-CM | POA: Diagnosis not present

## 2022-04-02 DIAGNOSIS — E7800 Pure hypercholesterolemia, unspecified: Secondary | ICD-10-CM | POA: Diagnosis not present

## 2022-04-02 DIAGNOSIS — Z8042 Family history of malignant neoplasm of prostate: Secondary | ICD-10-CM

## 2022-04-02 DIAGNOSIS — I10 Essential (primary) hypertension: Secondary | ICD-10-CM

## 2022-04-02 MED ORDER — LOSARTAN POTASSIUM 50 MG PO TABS: 50.0000 mg | ORAL_TABLET | Freq: Every day | ORAL | 1 refills | Status: DC

## 2022-04-02 MED ORDER — AMLODIPINE BESYLATE 10 MG PO TABS: 10.0000 mg | ORAL_TABLET | Freq: Every day | ORAL | 3 refills | Status: DC

## 2022-04-02 MED ORDER — SIMVASTATIN 20 MG PO TABS: 20.0000 mg | ORAL_TABLET | Freq: Every day | ORAL | 3 refills | Status: DC

## 2022-04-02 NOTE — Patient Instructions (Addendum)
Avoid red meat/ fried foods/ egg yolks/ fatty breakfast meats/ butter, cheese and high fat dairy/ and shellfish   Drink lots of fluids   Watch your sodium intake  Processed foods have a lot  Eat more fluids /veggies and lean protein /avoid more processed foods   Continue current medicines  Add losartan 50 mg once daily in the am  If any problems/ side effects let us know   Follow up in approx 1 month

## 2022-04-02 NOTE — Assessment & Plan Note (Signed)
Reviewed health habits including diet and exercise and skin cancer prevention Reviewed appropriate screening tests for age  Also reviewed health mt list, fam hx and immunization status , as well as social and family history   See HPI Labs reviewed No clinical prostate/voiding changes  Plans to get a flu shot at work this fall Tdap updated  Will discuss colon cancer screening at 43 along with prostate cancer screening  Sees dermatology and has Moh's procedure scheduled for spot on nose soon  Now using more sun protection  Counseled on water intake in the heat

## 2022-04-02 NOTE — Assessment & Plan Note (Signed)
bp is not at goal BP Readings from Last 1 Encounters:  04/02/22 (!) 146/90   Taking amlodipine 10 mg daily  Ace in the past caused cough Will add losartan 50 mg daily (inst to call if side eff or sympt of hypotension)   Most recent labs reviewed  Disc lifstyle change with low sodium diet and exercise  Follow up in about a month for visit and labs

## 2022-04-02 NOTE — Progress Notes (Signed)
Subjective:    Patient ID: Robert Dickson, male    DOB: [birth year], 41 y.o.   MRN: [id number]  HPI Here for health maintenance exam and to review chronic medical problems    Wt Readings from Last 3 Encounters:  04/02/22 170 lb 4 oz (77.2 kg)  03/14/21 170 lb 4 oz (77.2 kg)  03/13/20 171 lb 7 oz (77.8 kg)   26.27 kg/m  Feeling ok  Taking care of himself   Work for exercise [employer] job  Eating healthier than in the past   Trying to cut back steak  More salads for meals    Immunization History  Administered Date(s) Administered   Influenza Split 06/28/2012   Influenza,inj,Quad PF,6+ Mos 07/20/2013, 06/27/2014   Td 07/29/1999   Tdap 12/09/2010   Health Maintenance Due  Topic Date Due   Hepatitis C Screening  Never done   TETANUS/TDAP  12/08/2020   Flu shot- declines now  Will get at work most likely    Tdap 2012- is due    Prostate health  PGF had prostate cancer before age 21 No change in urinary habits Rarely nocturia - not often  No change in stream   No new family history   Has appt on 18th for moh's surgery on nose  ? Basal cell   He wears sunscreen at beach Not at work Now he is starting to use on face and ears daily   Wears sunscreen clothes for fishing    HTN bp is stable today  No cp or palpitations or headaches or edema  No side effects to medicines  BP Readings from Last 3 Encounters:  04/02/22 (!) 146/90  03/14/21 (!) 143/88  03/13/20 140/85     Amlodipine 10 mg daily   Does not check outside the office  Feels fine   Drinks lots of fluids   Lab Results  Component Value Date   CREATININE 1.24 03/26/2022   BUN 10 03/26/2022   NA 137 03/26/2022   K 4.8 03/26/2022   CL 101 03/26/2022   CO2 27 03/26/2022   Hyperlipidemia Lab Results  Component Value Date   CHOL 207 (H) 03/26/2022   CHOL 217 (H) 03/14/2021   CHOL 247 (H) 03/09/2020   Lab Results  Component Value Date   HDL 72.40 03/26/2022   HDL 68.10 03/14/2021    HDL 73.90 03/09/2020   Lab Results  Component Value Date   LDLCALC 112 (H) 03/07/2019   LDLCALC 88 12/05/2016   LDLCALC 112 (H) 06/27/2014   Lab Results  Component Value Date   TRIG 207.0 (H) 03/26/2022   TRIG 241.0 (H) 03/14/2021   TRIG 226.0 (H) 03/09/2020   Lab Results  Component Value Date   CHOLHDL 3 03/26/2022   CHOLHDL 3 03/14/2021   CHOLHDL 3 03/09/2020   Lab Results  Component Value Date   LDLDIRECT 101.0 03/26/2022   LDLDIRECT 126.0 03/14/2021   LDLDIRECT 134.0 03/09/2020   Simvastatin 20 mg daily  HDL up  LDL down   Trying to eat less steaks - LDL down from 126 to 101  Tends to split a steak as well   Tries to avoid fried food /less fries  Some fried okra (in season)    Lab Results  Component Value Date   ALT 32 03/26/2022   AST 27 03/26/2022   ALKPHOS 83 03/26/2022   BILITOT 0.3 03/26/2022    Other labs Lab Results  Component Value Date   WBC 6.4 03/26/2022  HGB 15.1 03/26/2022   HCT 45.5 03/26/2022   MCV 87.4 03/26/2022   PLT 307.0 03/26/2022   Lab Results  Component Value Date   TSH 3.00 03/26/2022   Patient Active Problem List   Diagnosis Date Noted   Family history of prostate cancer 03/10/2019   H/O fracture of ankle 08/04/2015   Routine general medical examination at a health care facility 12/09/2010   ALLERGIC RHINITIS 04/05/2010   Hyperlipidemia 11/11/2006   Essential hypertension 11/11/2006   MURMUR 11/11/2006   Past Medical History:  Diagnosis Date   Allergy    allergic rhinitis   External hemorrhoid    Hyperlipidemia    Hypertension    Past Surgical History:  Procedure Laterality Date   ORIF ANKLE FRACTURE Right 08/05/2015   Procedure: OPEN REDUCTION INTERNAL FIXATION (ORIF) ANKLE FRACTURE;  Surgeon: Kennedy Bucker, MD;  Location: [facility] OR[facility];  Service: Orthopedics;  Laterality: Right;   Social History   Tobacco Use   Smoking status: Never   Smokeless tobacco: Former  Substance Use Topics   Alcohol use: Yes     Alcohol/week: 0.0 standard drinks of alcohol    Comment: 1-2 drinks/day(a littlem ore in summer)   Drug use: No   Family History  Problem Relation Age of Onset   Hypertension Father    Prostate cancer Paternal Grandfather    Allergies  Allergen Reactions   Lisinopril Cough    ? cough   Current Outpatient Medications on File Prior to Visit  Medication Sig Dispense Refill   albuterol (VENTOLIN HFA) 108 (90 Base) MCG/ACT inhaler TAKE 2 PUFFS BY MOUTH EVERY 6 HOURS AS NEEDED FOR WHEEZE OR SHORTNESS OF BREATH 6.7 each 5   Multiple Vitamin (MULTIVITAMIN) capsule Take 1 capsule by mouth daily.       No current facility-administered medications on file prior to visit.     Review of Systems  Constitutional:  Negative for activity change, appetite change, fatigue, fever and unexpected weight change.  HENT:  Negative for congestion, rhinorrhea, sore throat and trouble swallowing.   Eyes:  Negative for pain, redness, itching and visual disturbance.  Respiratory:  Negative for cough, chest tightness, shortness of breath and wheezing.   Cardiovascular:  Negative for chest pain and palpitations.  Gastrointestinal:  Negative for abdominal pain, blood in stool, constipation, diarrhea and nausea.  Endocrine: Negative for cold intolerance, heat intolerance, polydipsia and polyuria.  Genitourinary:  Negative for difficulty urinating, dysuria, frequency and urgency.  Musculoskeletal:  Negative for arthralgias, joint swelling and myalgias.  Skin:  Negative for pallor and rash.  Neurological:  Negative for dizziness, tremors, weakness, numbness and headaches.  Hematological:  Negative for adenopathy. Does not bruise/bleed easily.  Psychiatric/Behavioral:  Negative for decreased concentration and dysphoric mood. The patient is not nervous/anxious.        Objective:   Physical Exam Constitutional:      General: He is not in acute distress.    Appearance: Normal appearance. He is well-developed and  normal weight. He is not ill-appearing or diaphoretic.  HENT:     Head: Normocephalic and atraumatic.     Right Ear: Tympanic membrane, ear canal and external ear normal.     Left Ear: Tympanic membrane, ear canal and external ear normal.     Nose: Nose normal. No congestion.     Mouth/Throat:     Mouth: Mucous membranes are moist.     Pharynx: Oropharynx is clear. No posterior oropharyngeal erythema.  Eyes:     General:  No scleral icterus.       Right eye: No discharge.        Left eye: No discharge.     Conjunctiva/sclera: Conjunctivae normal.     Pupils: Pupils are equal, round, and reactive to light.  Neck:     Thyroid: No thyromegaly.     Vascular: No carotid bruit or JVD.  Cardiovascular:     Rate and Rhythm: Normal rate and regular rhythm.     Pulses: Normal pulses.     Heart sounds: Normal heart sounds.     No gallop.  Pulmonary:     Effort: Pulmonary effort is normal. No respiratory distress.     Breath sounds: Normal breath sounds. No wheezing or rales.     Comments: Good air exch Chest:     Chest wall: No tenderness.  Abdominal:     General: Bowel sounds are normal. There is no distension or abdominal bruit.     Palpations: Abdomen is soft. There is no mass.     Tenderness: There is no abdominal tenderness.     Hernia: No hernia is present.  Musculoskeletal:        General: No tenderness.     Cervical back: Normal range of motion and neck supple. No rigidity. No muscular tenderness.     Right lower leg: No edema.     Left lower leg: No edema.  Lymphadenopathy:     Cervical: No cervical adenopathy.  Skin:    General: Skin is warm and dry.     Coloration: Skin is not pale.     Findings: No erythema or rash.     Comments: Solar lentigines diffusely Tanned   Small erythematous scab on nose from recent bx  Neurological:     Mental Status: He is alert.     Cranial Nerves: No cranial nerve deficit.     Motor: No abnormal muscle tone.     Coordination:  Coordination normal.     Gait: Gait normal.     Deep Tendon Reflexes: Reflexes are normal and symmetric. Reflexes normal.  Psychiatric:        Attention and Perception: Attention normal.        Mood and Affect: Mood normal.        Cognition and Memory: Cognition and memory normal.     Comments: Pleasant            Assessment & Plan:   Problem List Items Addressed This Visit       Cardiovascular and Mediastinum   Essential hypertension    bp is not at goal BP Readings from Last 1 Encounters:  04/02/22 (!) 146/90  Taking amlodipine 10 mg daily  Ace in the past caused cough Will add losartan 50 mg daily (inst to call if side eff or sympt of hypotension)   Most recent labs reviewed  Disc lifstyle change with low sodium diet and exercise  Follow up in about a month for visit and labs       Relevant Medications   losartan (COZAAR) 50 MG tablet   amLODipine (NORVASC) 10 MG tablet   simvastatin (ZOCOR) 20 MG tablet     Other   Family history of prostate cancer   Hyperlipidemia    Disc goals for lipids and reasons to control them Rev last labs with pt Rev low sat fat diet in detail  HDL is up into 70s LDL down to 101  Commended some lifestyle change Plan to continue simvastatin 20  mg daily   Adv to continue eating less red meat and fried foods       Relevant Medications   losartan (COZAAR) 50 MG tablet   amLODipine (NORVASC) 10 MG tablet   simvastatin (ZOCOR) 20 MG tablet   Routine general medical examination at a health care facility - Primary    Reviewed health habits including diet and exercise and skin cancer prevention Reviewed appropriate screening tests for age  Also reviewed health mt list, fam hx and immunization status , as well as social and family history   See HPI Labs reviewed No clinical prostate/voiding changes  Plans to get a flu shot at work this fall Tdap updated  Will discuss colon cancer screening at 45 along with prostate cancer  screening  Sees dermatology and has Moh's procedure scheduled for spot on nose soon  Now using more sun protection  Counseled on water intake in the heat        Relevant Orders   Tdap vaccine greater than or equal to 7yo IM (Completed)   Other Visit Diagnoses     Need for Tdap vaccination       Relevant Orders   Tdap vaccine greater than or equal to 7yo IM (Completed)

## 2022-04-02 NOTE — Assessment & Plan Note (Signed)
Disc goals for lipids and reasons to control them Rev last labs with pt Rev low sat fat diet in detail  HDL is up into 70s LDL down to 101  Commended some lifestyle change Plan to continue simvastatin 20 mg daily   Adv to continue eating less red meat and fried foods

## 2022-05-02 ENCOUNTER — Ambulatory Visit: Payer: Commercial Managed Care - PPO | Admitting: Family Medicine

## 2022-05-05 ENCOUNTER — Ambulatory Visit (INDEPENDENT_AMBULATORY_CARE_PROVIDER_SITE_OTHER): Payer: Commercial Managed Care - PPO | Admitting: Family Medicine

## 2022-05-05 ENCOUNTER — Encounter: Payer: Self-pay | Admitting: Family Medicine

## 2022-05-05 VITALS — BP 128/80 | HR 83 | Temp 97.6°F | Ht 67.5 in | Wt 175.1 lb

## 2022-05-05 DIAGNOSIS — I10 Essential (primary) hypertension: Secondary | ICD-10-CM | POA: Diagnosis not present

## 2022-05-05 LAB — BASIC METABOLIC PANEL
BUN: 9 mg/dL (ref 6–23)
CO2: 27 mEq/L (ref 19–32)
Calcium: 9.9 mg/dL (ref 8.4–10.5)
Chloride: 101 mEq/L (ref 96–112)
Creatinine, Ser: 1.13 mg/dL (ref 0.40–1.50)
GFR: 79.91 mL/min (ref >60.00)
Glucose, Bld: 89 mg/dL (ref 70–99)
Potassium: 4.2 mEq/L (ref 3.5–5.1)
Sodium: 137 mEq/L (ref 135–145)

## 2022-05-05 NOTE — Patient Instructions (Signed)
Take care of yourself  Eat a healthy balanced diet  Stay active  Continue current medicines   Lab today for chem panel  If all looks good we will continue your medicines without change

## 2022-05-05 NOTE — Progress Notes (Signed)
Subjective:    Patient ID: Robert Dickson, male    DOB: 25-Feb-1981, 41 y.o.   MRN: [id number]  HPI Pt presents for f/u of HTN  Wt Readings from Last 3 Encounters:  05/05/22 175 lb 2 oz (79.4 kg)  04/02/22 170 lb 4 oz (77.2 kg)  03/14/21 170 lb 4 oz (77.2 kg)   27.02 kg/m  HTN bp is stable today  No cp or palpitations or headaches or edema  No side effects to medicines  BP Readings from Last 3 Encounters:  05/05/22 128/80  04/02/22 (!) 146/90  03/14/21 (!) 143/88     Amlodipine 10 mg daily  Last visit added losartan 50 mg daily   Feels ok  No side effects   Has been checking it  As low as 109/60s , other days 841L to 244W systolic   Did not end up needing Moh's surgery on nose Watching an area every 6 month  Patient Active Problem List   Diagnosis Date Noted   Family history of prostate cancer 03/10/2019   H/O fracture of ankle 08/04/2015   Routine general medical examination at a health care facility 12/09/2010   ALLERGIC RHINITIS 04/05/2010   Hyperlipidemia 11/11/2006   Essential hypertension 11/11/2006   MURMUR 11/11/2006   Past Medical History:  Diagnosis Date   Allergy    allergic rhinitis   External hemorrhoid    Hyperlipidemia    Hypertension    Past Surgical History:  Procedure Laterality Date   ORIF ANKLE FRACTURE Right 08/05/2015   Procedure: OPEN REDUCTION INTERNAL FIXATION (ORIF) ANKLE FRACTURE;  Surgeon: Hessie Knows, MD;  Location: [facility] ORS;  Service: Orthopedics;  Laterality: Right;   Social History   Tobacco Use   Smoking status: Never   Smokeless tobacco: Former  Substance Use Topics   Alcohol use: Yes    Alcohol/week: 0.0 standard drinks of alcohol    Comment: 1-2 drinks/day(a littlem ore in summer)   Drug use: No   Family History  Problem Relation Age of Onset   Hypertension Father    Prostate cancer Paternal Grandfather    Allergies  Allergen Reactions   Lisinopril Cough    ? cough   Current Outpatient Medications on  File Prior to Visit  Medication Sig Dispense Refill   albuterol (VENTOLIN HFA) 108 (90 Base) MCG/ACT inhaler TAKE 2 PUFFS BY MOUTH EVERY 6 HOURS AS NEEDED FOR WHEEZE OR SHORTNESS OF BREATH 6.7 each 5   amLODipine (NORVASC) 10 MG tablet Take 1 tablet (10 mg total) by mouth daily. 90 tablet 3   losartan (COZAAR) 50 MG tablet Take 1 tablet (50 mg total) by mouth daily. 90 tablet 1   Multiple Vitamin (MULTIVITAMIN) capsule Take 1 capsule by mouth daily.       simvastatin (ZOCOR) 20 MG tablet Take 1 tablet (20 mg total) by mouth at bedtime. 90 tablet 3   No current facility-administered medications on file prior to visit.     Review of Systems  Constitutional:  Negative for activity change, appetite change, fatigue, fever and unexpected weight change.  HENT:  Negative for congestion, rhinorrhea, sore throat and trouble swallowing.   Eyes:  Negative for pain, redness, itching and visual disturbance.  Respiratory:  Negative for cough, chest tightness, shortness of breath and wheezing.   Cardiovascular:  Negative for chest pain and palpitations.  Gastrointestinal:  Negative for abdominal pain, blood in stool, constipation, diarrhea and nausea.  Endocrine: Negative for cold intolerance, heat intolerance, polydipsia and polyuria.  Genitourinary:  Negative for difficulty urinating, dysuria, frequency and urgency.  Musculoskeletal:  Negative for arthralgias, joint swelling and myalgias.  Skin:  Negative for pallor and rash.  Neurological:  Negative for dizziness, tremors, weakness, numbness and headaches.  Hematological:  Negative for adenopathy. Does not bruise/bleed easily.  Psychiatric/Behavioral:  Negative for decreased concentration and dysphoric mood. The patient is not nervous/anxious.        Objective:   Physical Exam Constitutional:      General: He is not in acute distress.    Appearance: Normal appearance. He is well-developed and normal weight. He is not ill-appearing or diaphoretic.   HENT:     Head: Normocephalic and atraumatic.  Eyes:     Conjunctiva/sclera: Conjunctivae normal.     Pupils: Pupils are equal, round, and reactive to light.  Neck:     Thyroid: No thyromegaly.     Vascular: No carotid bruit or JVD.  Cardiovascular:     Rate and Rhythm: Normal rate and regular rhythm.     Heart sounds: Normal heart sounds.     No gallop.  Pulmonary:     Effort: Pulmonary effort is normal. No respiratory distress.     Breath sounds: Normal breath sounds. No wheezing or rales.  Abdominal:     General: Abdomen is flat. There is no abdominal bruit.  Musculoskeletal:     Cervical back: Normal range of motion and neck supple.     Right lower leg: No edema.     Left lower leg: No edema.  Lymphadenopathy:     Cervical: No cervical adenopathy.  Skin:    General: Skin is warm and dry.     Coloration: Skin is not pale.     Findings: No rash.  Neurological:     Mental Status: He is alert.     Coordination: Coordination normal.     Deep Tendon Reflexes: Reflexes are normal and symmetric. Reflexes normal.  Psychiatric:        Mood and Affect: Mood normal.           Assessment & Plan:   Problem List Items Addressed This Visit       Cardiovascular and Mediastinum   Essential hypertension - Primary    bp in fair control at this time  BP Readings from Last 1 Encounters:  05/05/22 128/80  No changes needed Most recent labs reviewed  Disc lifstyle change with low sodium diet and exercise  bp is improved with addn of losartan 50 mg daily Taking amlodipine 10 mg daily  Tolerating all well  bmet today        Relevant Orders   Basic metabolic panel

## 2022-05-05 NOTE — Assessment & Plan Note (Signed)
bp in fair control at this time  BP Readings from Last 1 Encounters:  05/05/22 128/80   No changes needed Most recent labs reviewed  Disc lifstyle change with low sodium diet and exercise  bp is improved with addn of losartan 50 mg daily Taking amlodipine 10 mg daily  Tolerating all well  bmet today

## 2022-05-06 ENCOUNTER — Encounter: Payer: Self-pay | Admitting: *Deleted

## 2022-09-23 ENCOUNTER — Other Ambulatory Visit: Payer: Self-pay | Admitting: Family Medicine

## 2023-03-19 ENCOUNTER — Other Ambulatory Visit: Payer: Self-pay | Admitting: Family Medicine

## 2023-03-30 ENCOUNTER — Telehealth: Payer: Self-pay | Admitting: Family Medicine

## 2023-03-30 DIAGNOSIS — E7800 Pure hypercholesterolemia, unspecified: Secondary | ICD-10-CM

## 2023-03-30 DIAGNOSIS — I10 Essential (primary) hypertension: Secondary | ICD-10-CM

## 2023-03-30 NOTE — Telephone Encounter (Signed)
-----   Message from Lovena Neighbours sent at 03/16/2023 11:47 AM EDT ----- Regarding: Labs 9.3.24 Please put physical lab orders in future. Thank you, Denny Peon

## 2023-03-31 ENCOUNTER — Other Ambulatory Visit (INDEPENDENT_AMBULATORY_CARE_PROVIDER_SITE_OTHER): Payer: Commercial Managed Care - PPO

## 2023-03-31 DIAGNOSIS — E7800 Pure hypercholesterolemia, unspecified: Secondary | ICD-10-CM

## 2023-03-31 DIAGNOSIS — I10 Essential (primary) hypertension: Secondary | ICD-10-CM | POA: Diagnosis not present

## 2023-03-31 LAB — LIPID PANEL
Cholesterol: 221 mg/dL — ABNORMAL HIGH (ref 0–200)
HDL: 65.9 mg/dL (ref >39.00)
LDL Cholesterol: 105 mg/dL — ABNORMAL HIGH (ref 0–99)
NonHDL: 155.04
Total CHOL/HDL Ratio: 3
Triglycerides: 248 mg/dL — ABNORMAL HIGH (ref 0.0–149.0)
VLDL: 49.6 mg/dL — ABNORMAL HIGH (ref 0.0–40.0)

## 2023-03-31 LAB — COMPREHENSIVE METABOLIC PANEL
ALT: 25 U/L (ref 0–53)
AST: 20 U/L (ref 0–37)
Albumin: 4.4 g/dL (ref 3.5–5.2)
Alkaline Phosphatase: 90 U/L (ref 39–117)
BUN: 9 mg/dL (ref 6–23)
CO2: 27 meq/L (ref 19–32)
Calcium: 9.8 mg/dL (ref 8.4–10.5)
Chloride: 102 meq/L (ref 96–112)
Creatinine, Ser: 1.2 mg/dL (ref 0.40–1.50)
GFR: 73.88 mL/min (ref >60.00)
Glucose, Bld: 85 mg/dL (ref 70–99)
Potassium: 4.6 meq/L (ref 3.5–5.1)
Sodium: 138 meq/L (ref 135–145)
Total Bilirubin: 0.3 mg/dL (ref 0.2–1.2)
Total Protein: 7.2 g/dL (ref 6.0–8.3)

## 2023-03-31 LAB — CBC WITH DIFFERENTIAL/PLATELET
Basophils Absolute: 0 10*3/uL (ref 0.0–0.1)
Basophils Relative: 0.6 % (ref 0.0–3.0)
Eosinophils Absolute: 0.4 10*3/uL (ref 0.0–0.7)
Eosinophils Relative: 5.7 % — ABNORMAL HIGH (ref 0.0–5.0)
HCT: 46.9 % (ref 39.0–52.0)
Hemoglobin: 15.5 g/dL (ref 13.0–17.0)
Lymphocytes Relative: 33.1 % (ref 12.0–46.0)
Lymphs Abs: 2.4 10*3/uL (ref 0.7–4.0)
MCHC: 33.1 g/dL (ref 30.0–36.0)
MCV: 86.4 fl (ref 78.0–100.0)
Monocytes Absolute: 0.8 10*3/uL (ref 0.1–1.0)
Monocytes Relative: 10.4 % (ref 3.0–12.0)
Neutro Abs: 3.7 10*3/uL (ref 1.4–7.7)
Neutrophils Relative %: 50.2 % (ref 43.0–77.0)
Platelets: 355 10*3/uL (ref 150.0–400.0)
RBC: 5.43 Mil/uL (ref 4.22–5.81)
RDW: 13.2 % (ref 11.5–15.5)
WBC: 7.3 10*3/uL (ref 4.0–10.5)

## 2023-04-01 LAB — TSH: TSH: 4.27 u[IU]/mL (ref 0.35–5.50)

## 2023-04-06 ENCOUNTER — Ambulatory Visit (INDEPENDENT_AMBULATORY_CARE_PROVIDER_SITE_OTHER): Payer: Commercial Managed Care - PPO | Admitting: Family Medicine

## 2023-04-06 ENCOUNTER — Encounter: Payer: Self-pay | Admitting: Family Medicine

## 2023-04-06 VITALS — BP 130/80 | HR 82 | Temp 97.6°F | Ht 67.5 in | Wt 174.2 lb

## 2023-04-06 DIAGNOSIS — I10 Essential (primary) hypertension: Secondary | ICD-10-CM | POA: Diagnosis not present

## 2023-04-06 DIAGNOSIS — Z0000 Encounter for general adult medical examination without abnormal findings: Secondary | ICD-10-CM

## 2023-04-06 DIAGNOSIS — E7800 Pure hypercholesterolemia, unspecified: Secondary | ICD-10-CM

## 2023-04-06 DIAGNOSIS — Z8042 Family history of malignant neoplasm of prostate: Secondary | ICD-10-CM | POA: Diagnosis not present

## 2023-04-06 MED ORDER — SIMVASTATIN 20 MG PO TABS: 20.0000 mg | ORAL_TABLET | Freq: Every day | ORAL | 3 refills | Status: DC

## 2023-04-06 MED ORDER — AMLODIPINE BESYLATE 10 MG PO TABS: 10.0000 mg | ORAL_TABLET | Freq: Every day | ORAL | 3 refills | Status: DC

## 2023-04-06 MED ORDER — LOSARTAN POTASSIUM 50 MG PO TABS: 50.0000 mg | ORAL_TABLET | Freq: Every day | ORAL | 3 refills | Status: DC

## 2023-04-06 NOTE — Progress Notes (Signed)
Subjective:    Patient ID: Robert Dickson, male    DOB: April 25, 1981, 42 y.o.   MRN: [id number]  HPI  Here for health maintenance exam and to review chronic medical problems   Wt Readings from Last 3 Encounters:  04/06/23 174 lb 4 oz (79 kg)  05/05/22 175 lb 2 oz (79.4 kg)  04/02/22 170 lb 4 oz (77.2 kg)   26.89 kg/m  Vitals:   04/06/23 0818 04/06/23 0849  BP: (!) 134/92 130/80  Pulse: 82   Temp: 97.6 F (36.4 C)   SpO2: 95%     Immunization History  Administered Date(s) Administered   Influenza Split 06/28/2012   Influenza,inj,Quad PF,6+ Mos 07/20/2013, 06/27/2014   Td 07/29/1999   Tdap 12/09/2010, 04/02/2022    Health Maintenance Due  Topic Date Due   Hepatitis C Screening  Never done   Flu shot -declines now May get it at work later   Prostate health PGF had prostate cancer  No voiding symptoms Rare nocturia    Bone health  Falls - none  Fractures-none  Supplements mvi  Exercise :  Riding bike in the spring  Work is physical / lot of heavy lifting   Dermatology care  [place] 2 weeks ago  Had a lesion- watching - almost had Moh's surgery and then they changed the approach   Mood    04/06/2023    8:22 AM 04/02/2022   11:40 AM 03/14/2021    9:14 AM 03/13/2020    9:07 AM 03/10/2019   10:18 AM  Depression screen PHQ 2/9  Decreased Interest 0 0 0 0 0  Down, Depressed, Hopeless 0 0 0 0 0  PHQ - 2 Score 0 0 0 0 0  Altered sleeping 0 0 0 0 0  Tired, decreased energy 0 0 0 0 0  Change in appetite 0 0 0 0 0  Feeling bad or failure about yourself  0 0 0 0 0  Trouble concentrating 0 0 0 0 0  Moving slowly or fidgety/restless 0 0 0 0 0  Suicidal thoughts 0 0 0 0 0  PHQ-9 Score 0 0 0 0 0  Difficult doing work/chores Not difficult at all Not difficult at all Not difficult at all Not difficult at all Not difficult at all    HTN bp is stable today  No cp or palpitations or headaches or edema  No side effects to medicines  BP Readings from Last 3  Encounters:  04/06/23 130/80  05/05/22 128/80  04/02/22 (!) 146/90    Losartan 50 mg daily  Amlodipine 10 mg daily    Pulse Readings from Last 3 Encounters:  04/06/23 82  05/05/22 83  04/02/22 90   Lab Results  Component Value Date   NA 138 03/31/2023   K 4.6 03/31/2023   CO2 27 03/31/2023   GLUCOSE 85 03/31/2023   BUN 9 03/31/2023   CREATININE 1.20 03/31/2023   CALCIUM 9.8 03/31/2023   GFR 73.88 03/31/2023   GFRNONAA 55 (L) 11/13/2019   Drinks lot of fluids   Lab Results  Component Value Date   CALCIUM 9.8 03/31/2023   PHOS 3.5 04/02/2010    Hyperlipidemia Lab Results  Component Value Date   CHOL 221 (H) 03/31/2023   CHOL 207 (H) 03/26/2022   CHOL 217 (H) 03/14/2021   Lab Results  Component Value Date   HDL 65.90 03/31/2023   HDL 72.40 03/26/2022   HDL 68.10 03/14/2021   Lab Results  Component  Value Date   LDLCALC 105 (H) 03/31/2023   LDLCALC 112 (H) 03/07/2019   LDLCALC 88 12/05/2016   Lab Results  Component Value Date   TRIG 248.0 (H) 03/31/2023   TRIG 207.0 (H) 03/26/2022   TRIG 241.0 (H) 03/14/2021   Lab Results  Component Value Date   CHOLHDL 3 03/31/2023   CHOLHDL 3 03/26/2022   CHOLHDL 3 03/14/2021   Lab Results  Component Value Date   LDLDIRECT 101.0 03/26/2022   LDLDIRECT 126.0 03/14/2021   LDLDIRECT 134.0 03/09/2020   Simvastatin 20 mg daily  Diet is fairly good at home  Likes steak  At work not as good /some fast food    Fair amount of starchy carbs No sweets/sugar    Lab Results  Component Value Date   ALT 25 03/31/2023   AST 20 03/31/2023   ALKPHOS 90 03/31/2023   BILITOT 0.3 03/31/2023     Other labs Lab Results  Component Value Date   WBC 7.3 03/31/2023   HGB 15.5 03/31/2023   HCT 46.9 03/31/2023   MCV 86.4 03/31/2023   PLT 355.0 03/31/2023   Lab Results  Component Value Date   TSH 4.27 03/31/2023     Patient Active Problem List   Diagnosis Date Noted   Family history of prostate cancer  03/10/2019   H/O fracture of ankle 08/04/2015   Routine general medical examination at a health care facility 12/09/2010   ALLERGIC RHINITIS 04/05/2010   Hyperlipidemia 11/11/2006   Essential hypertension 11/11/2006   MURMUR 11/11/2006   Past Medical History:  Diagnosis Date   Allergy    allergic rhinitis   External hemorrhoid    Hyperlipidemia    Hypertension    Past Surgical History:  Procedure Laterality Date   ORIF ANKLE FRACTURE Right 08/05/2015   Procedure: OPEN REDUCTION INTERNAL FIXATION (ORIF) ANKLE FRACTURE;  Surgeon: Kennedy Bucker, MD;  Location: [facility] OR[facility];  Service: Orthopedics;  Laterality: Right;   Social History   Tobacco Use   Smoking status: Never   Smokeless tobacco: Former  Substance Use Topics   Alcohol use: Yes    Alcohol/week: 0.0 standard drinks of alcohol    Comment: 1-2 drinks/day(a littlem ore in summer)   Drug use: No   Family History  Problem Relation Age of Onset   Hypertension Father    Prostate cancer Paternal Grandfather    Allergies  Allergen Reactions   Lisinopril Cough    ? cough   Current Outpatient Medications on File Prior to Visit  Medication Sig Dispense Refill   albuterol (VENTOLIN HFA) 108 (90 Base) MCG/ACT inhaler TAKE 2 PUFFS BY MOUTH EVERY 6 HOURS AS NEEDED FOR WHEEZE OR SHORTNESS OF BREATH 6.7 each 5   Multiple Vitamin (MULTIVITAMIN) capsule Take 1 capsule by mouth daily.       No current facility-administered medications on file prior to visit.    Review of Systems  Constitutional:  Negative for activity change, appetite change, fatigue, fever and unexpected weight change.  HENT:  Negative for congestion, rhinorrhea, sore throat and trouble swallowing.   Eyes:  Negative for pain, redness, itching and visual disturbance.  Respiratory:  Negative for cough, chest tightness, shortness of breath and wheezing.   Cardiovascular:  Negative for chest pain and palpitations.  Gastrointestinal:  Negative for abdominal pain, blood  in stool, constipation, diarrhea and nausea.  Endocrine: Negative for cold intolerance, heat intolerance, polydipsia and polyuria.  Genitourinary:  Negative for difficulty urinating, dysuria, frequency and urgency.  Musculoskeletal:  Negative for arthralgias, joint swelling and myalgias.  Skin:  Negative for pallor and rash.  Neurological:  Negative for dizziness, tremors, weakness, numbness and headaches.  Hematological:  Negative for adenopathy. Does not bruise/bleed easily.  Psychiatric/Behavioral:  Negative for decreased concentration and dysphoric mood. The patient is not nervous/anxious.        Objective:   Physical Exam Constitutional:      General: He is not in acute distress.    Appearance: Normal appearance. He is well-developed and normal weight. He is not ill-appearing or diaphoretic.  HENT:     Head: Normocephalic and atraumatic.     Right Ear: Tympanic membrane, ear canal and external ear normal.     Left Ear: Tympanic membrane, ear canal and external ear normal.     Nose: Nose normal. No congestion.     Mouth/Throat:     Mouth: Mucous membranes are moist.     Pharynx: Oropharynx is clear. No posterior oropharyngeal erythema.  Eyes:     General: No scleral icterus.       Right eye: No discharge.        Left eye: No discharge.     Conjunctiva/sclera: Conjunctivae normal.     Pupils: Pupils are equal, round, and reactive to light.  Neck:     Thyroid: No thyromegaly.     Vascular: No carotid bruit or JVD.  Cardiovascular:     Rate and Rhythm: Normal rate and regular rhythm.     Pulses: Normal pulses.     Heart sounds: Murmur heard.     No gallop.  Pulmonary:     Effort: Pulmonary effort is normal. No respiratory distress.     Breath sounds: Normal breath sounds. No wheezing or rales.     Comments: Good air exch Chest:     Chest wall: No tenderness.  Abdominal:     General: Bowel sounds are normal. There is no distension or abdominal bruit.     Palpations:  Abdomen is soft. There is no mass.     Tenderness: There is no abdominal tenderness.     Hernia: No hernia is present.  Musculoskeletal:        General: No tenderness.     Cervical back: Normal range of motion and neck supple. No rigidity. No muscular tenderness.     Right lower leg: No edema.     Left lower leg: No edema.  Lymphadenopathy:     Cervical: No cervical adenopathy.  Skin:    General: Skin is warm and dry.     Coloration: Skin is not pale.     Findings: No erythema or rash.     Comments: Solar lentigines diffusely   Neurological:     Mental Status: He is alert.     Cranial Nerves: No cranial nerve deficit.     Motor: No abnormal muscle tone.     Coordination: Coordination normal.     Gait: Gait normal.     Deep Tendon Reflexes: Reflexes are normal and symmetric. Reflexes normal.  Psychiatric:        Mood and Affect: Mood normal.        Cognition and Memory: Cognition and memory normal.           Assessment & Plan:   Problem List Items Addressed This Visit       Cardiovascular and Mediastinum   Essential hypertension    bp in fair control at this time  BP Readings from Last 1 Encounters:  04/06/23 130/80   No changes needed Most recent labs reviewed  Disc lifstyle change with low sodium diet and exercise  bp is improved with addn of losartan 50 mg daily Taking amlodipine 10 mg daily  Tolerating all well  bmet today        Relevant Medications   amLODipine (NORVASC) 10 MG tablet   losartan (COZAAR) 50 MG tablet   simvastatin (ZOCOR) 20 MG tablet     Other   Family history of prostate cancer    No clinical changes Will consider psa at 43       Hyperlipidemia    Disc goals for lipids and reasons to control them Rev last labs with pt Rev low sat fat diet in detail Simvastatin 20 mg daily  LDL is improved at 105 Triglycerides are up-will watch this       Relevant Medications   amLODipine (NORVASC) 10 MG tablet   losartan (COZAAR) 50  MG tablet   simvastatin (ZOCOR) 20 MG tablet   Routine general medical examination at a health care facility - Primary    Reviewed health habits including diet and exercise and skin cancer prevention Reviewed appropriate screening tests for age  Also reviewed health mt list, fam hx and immunization status , as well as social and family history   See HPI Labs reviewed and ordered Plans flu shot at work later in season  No issues with prostate /symptoms  Discussed bone health  Utd derm care  PHQ 0

## 2023-04-06 NOTE — Assessment & Plan Note (Signed)
Disc goals for lipids and reasons to control them Rev last labs with pt Rev low sat fat diet in detail Simvastatin 20 mg daily  LDL is improved at 105 Triglycerides are up-will watch this

## 2023-04-06 NOTE — Assessment & Plan Note (Signed)
No clinical changes Will consider psa at 43

## 2023-04-06 NOTE — Assessment & Plan Note (Signed)
bp in fair control at this time  BP Readings from Last 1 Encounters:  04/06/23 130/80   No changes needed Most recent labs reviewed  Disc lifstyle change with low sodium diet and exercise  bp is improved with addn of losartan 50 mg daily Taking amlodipine 10 mg daily  Tolerating all well  bmet today

## 2023-04-06 NOTE — Patient Instructions (Addendum)
For cholesterol Avoid red meat/ fried foods/ egg yolks/ fatty breakfast meats/ butter, cheese and high fat dairy/ and shellfish    Avoid fast food when you can   Take care of yourself   Stay active  Add exercise when you can Use sun protection  Consider a flu shot in the fall

## 2023-04-06 NOTE — Assessment & Plan Note (Signed)
Reviewed health habits including diet and exercise and skin cancer prevention Reviewed appropriate screening tests for age  Also reviewed health mt list, fam hx and immunization status , as well as social and family history   See HPI Labs reviewed and ordered Plans flu shot at work later in season  No issues with prostate /symptoms  Discussed bone health  Utd derm care  PHQ 0

## 2024-03-18 ENCOUNTER — Telehealth: Payer: Self-pay | Admitting: *Deleted

## 2024-03-18 DIAGNOSIS — Z0000 Encounter for general adult medical examination without abnormal findings: Secondary | ICD-10-CM

## 2024-03-18 DIAGNOSIS — E7800 Pure hypercholesterolemia, unspecified: Secondary | ICD-10-CM

## 2024-03-18 DIAGNOSIS — Z125 Encounter for screening for malignant neoplasm of prostate: Secondary | ICD-10-CM

## 2024-03-18 DIAGNOSIS — Z8042 Family history of malignant neoplasm of prostate: Secondary | ICD-10-CM

## 2024-03-18 DIAGNOSIS — R5382 Chronic fatigue, unspecified: Secondary | ICD-10-CM

## 2024-03-18 DIAGNOSIS — I10 Essential (primary) hypertension: Secondary | ICD-10-CM

## 2024-03-18 NOTE — Telephone Encounter (Signed)
 Copied from CRM #[id number]. Topic: Clinical - Request for Lab/Test Order >> Mar 18, 2024  4:48 PM Robert Dickson wrote: Reason for CRM: Patient would like to add hormone testing to his labs for his appt on 09/02- so he can discuss with Dr. Randeen for his physical on 09/09.   [phone] (M)

## 2024-03-19 NOTE — Telephone Encounter (Signed)
 What hormone is he referring to ?  Is he having symptoms That is not routinely done or covered under wellness or health mt but we can certainly talk about it   Thanks

## 2024-03-21 DIAGNOSIS — Z125 Encounter for screening for malignant neoplasm of prostate: Secondary | ICD-10-CM | POA: Insufficient documentation

## 2024-03-21 DIAGNOSIS — R5383 Other fatigue: Secondary | ICD-10-CM | POA: Insufficient documentation

## 2024-03-21 NOTE — Telephone Encounter (Signed)
 The orders are in  Added psa and also testosterone level to regular labs

## 2024-03-21 NOTE — Telephone Encounter (Signed)
 Called pt and he said that he does want labs done regardless if insurance will pay or not, he would like them done at lab appt instead of CPE so he wont have to get labs done twice. Pt said he just is feeling more fatigued lately and he has slowed down in his performance and would like his Testosterone checked any any other labs PCP thinks goes along with that like estrogen or prostate.

## 2024-03-28 ENCOUNTER — Telehealth: Payer: Self-pay | Admitting: Family Medicine

## 2024-03-28 DIAGNOSIS — E7800 Pure hypercholesterolemia, unspecified: Secondary | ICD-10-CM

## 2024-03-28 DIAGNOSIS — I10 Essential (primary) hypertension: Secondary | ICD-10-CM

## 2024-03-28 NOTE — Telephone Encounter (Signed)
-----   Message from Veva JINNY Ferrari sent at 03/15/2024  2:44 PM EDT ----- Regarding: Lab orders for Tue, 8.2.25 Patient is scheduled for CPX labs, please order future labs, Thanks , Veva

## 2024-03-29 ENCOUNTER — Other Ambulatory Visit (INDEPENDENT_AMBULATORY_CARE_PROVIDER_SITE_OTHER): Payer: Commercial Managed Care - PPO

## 2024-03-29 ENCOUNTER — Ambulatory Visit: Payer: Self-pay | Admitting: Family Medicine

## 2024-03-29 DIAGNOSIS — Z125 Encounter for screening for malignant neoplasm of prostate: Secondary | ICD-10-CM

## 2024-03-29 DIAGNOSIS — E7800 Pure hypercholesterolemia, unspecified: Secondary | ICD-10-CM | POA: Diagnosis not present

## 2024-03-29 DIAGNOSIS — I10 Essential (primary) hypertension: Secondary | ICD-10-CM | POA: Diagnosis not present

## 2024-03-29 DIAGNOSIS — R5382 Chronic fatigue, unspecified: Secondary | ICD-10-CM

## 2024-03-29 DIAGNOSIS — Z8042 Family history of malignant neoplasm of prostate: Secondary | ICD-10-CM

## 2024-03-29 LAB — CBC WITH DIFFERENTIAL/PLATELET
Basophils Absolute: 0.1 K/uL (ref 0.0–0.1)
Basophils Relative: 0.7 % (ref 0.0–3.0)
Eosinophils Absolute: 0.5 K/uL (ref 0.0–0.7)
Eosinophils Relative: 6.7 % — ABNORMAL HIGH (ref 0.0–5.0)
HCT: 46.1 % (ref 39.0–52.0)
Hemoglobin: 15.2 g/dL (ref 13.0–17.0)
Lymphocytes Relative: 32 % (ref 12.0–46.0)
Lymphs Abs: 2.3 K/uL (ref 0.7–4.0)
MCHC: 33 g/dL (ref 30.0–36.0)
MCV: 85.6 fl (ref 78.0–100.0)
Monocytes Absolute: 0.7 K/uL (ref 0.1–1.0)
Monocytes Relative: 10.2 % (ref 3.0–12.0)
Neutro Abs: 3.6 K/uL (ref 1.4–7.7)
Neutrophils Relative %: 50.4 % (ref 43.0–77.0)
Platelets: 339 K/uL (ref 150.0–400.0)
RBC: 5.39 Mil/uL (ref 4.22–5.81)
RDW: 13.4 % (ref 11.5–15.5)
WBC: 7.1 K/uL (ref 4.0–10.5)

## 2024-03-29 LAB — LIPID PANEL
Cholesterol: 217 mg/dL — ABNORMAL HIGH (ref 0–200)
HDL: 71.6 mg/dL (ref >39.00)
LDL Cholesterol: 104 mg/dL — ABNORMAL HIGH (ref 0–99)
NonHDL: 145.42
Total CHOL/HDL Ratio: 3
Triglycerides: 209 mg/dL — ABNORMAL HIGH (ref 0.0–149.0)
VLDL: 41.8 mg/dL — ABNORMAL HIGH (ref 0.0–40.0)

## 2024-03-29 LAB — COMPREHENSIVE METABOLIC PANEL WITH GFR
ALT: 30 U/L (ref 0–53)
AST: 25 U/L (ref 0–37)
Albumin: 4.7 g/dL (ref 3.5–5.2)
Alkaline Phosphatase: 94 U/L (ref 39–117)
BUN: 8 mg/dL (ref 6–23)
CO2: 28 meq/L (ref 19–32)
Calcium: 9.7 mg/dL (ref 8.4–10.5)
Chloride: 99 meq/L (ref 96–112)
Creatinine, Ser: 1.23 mg/dL (ref 0.40–1.50)
GFR: 71.22 mL/min (ref >60.00)
Glucose, Bld: 92 mg/dL (ref 70–99)
Potassium: 5 meq/L (ref 3.5–5.1)
Sodium: 138 meq/L (ref 135–145)
Total Bilirubin: 0.5 mg/dL (ref 0.2–1.2)
Total Protein: 7.4 g/dL (ref 6.0–8.3)

## 2024-03-29 LAB — TESTOSTERONE: Testosterone: 485.36 ng/dL (ref 300.00–890.00)

## 2024-03-29 LAB — PSA: PSA: 2.11 ng/mL (ref 0.10–4.00)

## 2024-03-29 LAB — TSH: TSH: 3.62 u[IU]/mL (ref 0.35–5.50)

## 2024-04-05 ENCOUNTER — Encounter: Payer: Self-pay | Admitting: Family Medicine

## 2024-04-05 ENCOUNTER — Ambulatory Visit (INDEPENDENT_AMBULATORY_CARE_PROVIDER_SITE_OTHER): Payer: Commercial Managed Care - PPO | Admitting: Family Medicine

## 2024-04-05 VITALS — BP 129/82 | HR 72 | Temp 98.0°F | Ht 67.5 in | Wt 175.0 lb

## 2024-04-05 DIAGNOSIS — I10 Essential (primary) hypertension: Secondary | ICD-10-CM | POA: Diagnosis not present

## 2024-04-05 DIAGNOSIS — R6882 Decreased libido: Secondary | ICD-10-CM | POA: Insufficient documentation

## 2024-04-05 DIAGNOSIS — E7800 Pure hypercholesterolemia, unspecified: Secondary | ICD-10-CM

## 2024-04-05 DIAGNOSIS — R5382 Chronic fatigue, unspecified: Secondary | ICD-10-CM

## 2024-04-05 DIAGNOSIS — Z0000 Encounter for general adult medical examination without abnormal findings: Secondary | ICD-10-CM | POA: Diagnosis not present

## 2024-04-05 MED ORDER — AMLODIPINE BESYLATE 10 MG PO TABS: 10.0000 mg | ORAL_TABLET | Freq: Every day | ORAL | 3 refills | Status: AC

## 2024-04-05 MED ORDER — SIMVASTATIN 20 MG PO TABS: 20.0000 mg | ORAL_TABLET | Freq: Every day | ORAL | 3 refills | Status: AC

## 2024-04-05 MED ORDER — LOSARTAN POTASSIUM 50 MG PO TABS: 50.0000 mg | ORAL_TABLET | Freq: Every day | ORAL | 3 refills | Status: AC

## 2024-04-05 MED ORDER — ALBUTEROL SULFATE HFA 108 (90 BASE) MCG/ACT IN AERS: 2.0000 | INHALATION_SPRAY | Freq: Four times a day (QID) | RESPIRATORY_TRACT | 3 refills | Status: AC | PRN

## 2024-04-05 NOTE — Progress Notes (Unsigned)
 Subjective:    Patient ID: Robert Dickson, male    DOB: 1980-12-04, 43 y.o.   MRN: [id number]  HPI  Here for health maintenance exam and to review chronic medical problems   Wt Readings from Last 3 Encounters:  04/05/24 175 lb (79.4 kg)  04/06/23 174 lb 4 oz (79 kg)  05/05/22 175 lb 2 oz (79.4 kg)   27.00 kg/m  Vitals:   04/05/24 0800 04/05/24 0822  BP: 134/86 129/82  Pulse: 72   Temp: 98 F (36.7 C)   SpO2: 98%     Immunization History  Administered Date(s) Administered   Influenza Split 06/28/2012   Influenza,inj,Quad PF,6+ Mos 07/20/2013, 06/27/2014   Td 07/29/1999   Tdap 12/09/2010, 04/02/2022    Health Maintenance Due  Topic Date Due   HIV Screening  Never done   Hepatitis C Screening  Never done   Hepatitis B Vaccines 19-59 Average Risk (1 of 3 - 19+ 3-dose series) Never done   HPV VACCINES (1 - Risk 3-dose SCDM series) Never done   Flu shot -declines   Feels ok in general    Prostate health Lab Results  Component Value Date   PSA 2.11 03/29/2024   Family history of prostate cancer  No problems urinating  Very seldom has nocturia    Colon cancer screening  Turns 43 next month   Bone health   Falls-none  Fractures-none  Supplements mvi  Eats a fairly balanced diet    Exercise  Work -walking/ heavy lifting  Often 10.000 steps  Climbing  Is active   Would incorporate more   Mood    04/05/2024    8:06 AM 04/06/2023    8:22 AM 04/02/2022   11:40 AM 03/14/2021    9:14 AM 03/13/2020    9:07 AM  Depression screen PHQ 2/9  Decreased Interest 0 0 0 0 0  Down, Depressed, Hopeless 0 0 0 0 0  PHQ - 2 Score 0 0 0 0 0  Altered sleeping 0 0 0 0 0  Tired, decreased energy 0 0 0 0 0  Change in appetite 0 0 0 0 0  Feeling bad or failure about yourself  0 0 0 0 0  Trouble concentrating 0 0 0 0 0  Moving slowly or fidgety/restless 0 0 0 0 0  Suicidal thoughts 0 0 0 0 0  PHQ-9 Score 0 0 0 0 0  Difficult doing work/chores Not difficult at all Not  difficult at all Not difficult at all Not difficult at all Not difficult at all   Some irritability lately  Less tolerance  Has control over it  Most of the time happy  Wondered about his hormone level  Testosterone  was normal  Less libido   No family history of mental health issues    HTN bp is stable today  No cp or palpitations or headaches or edema  No side effects to medicines  BP Readings from Last 3 Encounters:  04/05/24 129/82  04/06/23 130/80  05/05/22 128/80  Amlodipine  10 mg daily  Losartan  50 mg daily   Stable No issues   Lab Results  Component Value Date   NA 138 03/29/2024   K 5.0 03/29/2024   CO2 28 03/29/2024   GLUCOSE 92 03/29/2024   BUN 8 03/29/2024   CREATININE 1.23 03/29/2024   CALCIUM 9.7 03/29/2024   GFR 71.22 03/29/2024   GFRNONAA 55 (L) 11/13/2019   Hyperlipidemia Lab Results  Component Value Date  CHOL 217 (H) 03/29/2024   CHOL 221 (H) 03/31/2023   CHOL 207 (H) 03/26/2022   Lab Results  Component Value Date   HDL 71.60 03/29/2024   HDL 65.90 03/31/2023   HDL 72.40 03/26/2022   Lab Results  Component Value Date   LDLCALC 104 (H) 03/29/2024   LDLCALC 105 (H) 03/31/2023   LDLCALC 112 (H) 03/07/2019   Lab Results  Component Value Date   TRIG 209.0 (H) 03/29/2024   TRIG 248.0 (H) 03/31/2023   TRIG 207.0 (H) 03/26/2022   Lab Results  Component Value Date   CHOLHDL 3 03/29/2024   CHOLHDL 3 03/31/2023   CHOLHDL 3 03/26/2022   Lab Results  Component Value Date   LDLDIRECT 101.0 03/26/2022   LDLDIRECT 126.0 03/14/2021   LDLDIRECT 134.0 03/09/2020   Simvastatin  20 mg daily   The 10-year ASCVD risk score (Arnett DK, et al., 2019) is: 1.6%   Values used to calculate the score:     Age: 32 years     Clincally relevant sex: Male     Is Non-Hispanic African American: No     Diabetic: No     Tobacco smoker: No     Systolic Blood Pressure: 129 mmHg     Is BP treated: Yes     HDL Cholesterol: 71.6 mg/dL     Total  Cholesterol: 217 mg/dL   Other labs Lab Results  Component Value Date   NA 138 03/29/2024   K 5.0 03/29/2024   CO2 28 03/29/2024   GLUCOSE 92 03/29/2024   BUN 8 03/29/2024   CREATININE 1.23 03/29/2024   CALCIUM 9.7 03/29/2024   GFR 71.22 03/29/2024   GFRNONAA 55 (L) 11/13/2019   Lab Results  Component Value Date   ALT 30 03/29/2024   AST 25 03/29/2024   ALKPHOS 94 03/29/2024   BILITOT 0.5 03/29/2024   Lab Results  Component Value Date   WBC 7.1 03/29/2024   HGB 15.2 03/29/2024   HCT 46.1 03/29/2024   MCV 85.6 03/29/2024   PLT 339.0 03/29/2024   Lab Results  Component Value Date   TSH 3.62 03/29/2024   Not much alcohol     Patient Active Problem List   Diagnosis Date Noted   Low libido 04/05/2024   Prostate cancer screening 03/21/2024   Fatigue 03/21/2024   Family history of prostate cancer 03/10/2019   H/O fracture of ankle 08/04/2015   Routine general medical examination at a health care facility 12/09/2010   Allergic rhinitis 04/05/2010   Hyperlipidemia 11/11/2006   Essential hypertension 11/11/2006   MURMUR 11/11/2006   Past Medical History:  Diagnosis Date   Allergy    allergic rhinitis   External hemorrhoid    Hyperlipidemia    Hypertension    Past Surgical History:  Procedure Laterality Date   ORIF ANKLE FRACTURE Right 08/05/2015   Procedure: OPEN REDUCTION INTERNAL FIXATION (ORIF) ANKLE FRACTURE;  Surgeon: Ozell Flake, MD;  Location: [facility] ORS;  Service: Orthopedics;  Laterality: Right;   Social History   Tobacco Use   Smoking status: Never   Smokeless tobacco: Former  Substance Use Topics   Alcohol use: Yes    Alcohol/week: 0.0 standard drinks of alcohol    Comment: 1-2 drinks/day(a littlem ore in summer)   Drug use: No   Family History  Problem Relation Age of Onset   Hypertension Father    Prostate cancer Paternal Grandfather    Allergies  Allergen Reactions   Lisinopril  Cough    ?  cough   Current Outpatient Medications on  File Prior to Visit  Medication Sig Dispense Refill   albuterol  (VENTOLIN  HFA) 108 (90 Base) MCG/ACT inhaler TAKE 2 PUFFS BY MOUTH EVERY 6 HOURS AS NEEDED FOR WHEEZE OR SHORTNESS OF BREATH 6.7 each 5   amLODipine  (NORVASC ) 10 MG tablet Take 1 tablet (10 mg total) by mouth daily. 90 tablet 3   losartan  (COZAAR ) 50 MG tablet Take 1 tablet (50 mg total) by mouth daily. 90 tablet 3   Multiple Vitamin (MULTIVITAMIN) capsule Take 1 capsule by mouth daily.       simvastatin  (ZOCOR ) 20 MG tablet Take 1 tablet (20 mg total) by mouth daily at 6 PM. 90 tablet 3   No current facility-administered medications on file prior to visit.    Review of Systems     Objective:   Physical Exam        Assessment & Plan:   Problem List Items Addressed This Visit       Cardiovascular and Mediastinum   Essential hypertension     Other   Routine general medical examination at a health care facility - Primary   Low libido   Hyperlipidemia

## 2024-04-05 NOTE — Assessment & Plan Note (Signed)
 Reviewed health habits including diet and exercise and skin cancer prevention Reviewed appropriate screening tests for age  Also reviewed health mt list, fam hx and immunization status , as well as social and family history   See HPI Labs reviewed and ordered Health Maintenance  Topic Date Due   HIV Screening  Never done   Hepatitis C Screening  Never done   Hepatitis B Vaccine (1 of 3 - 19+ 3-dose series) Never done   HPV Vaccine (1 - Risk 3-dose SCDM series) Never done   Flu Shot  10/25/2024*   COVID-19 Vaccine (1) 03/30/2025*   DTaP/Tdap/Td vaccine (4 - Td or Tdap) 04/02/2032   Pneumococcal Vaccine  Aged Out   Meningitis B Vaccine  Aged Out  *Topic was postponed. The date shown is not the original due date.    Psa in normal range/will watch in light of family history of prostate cancer  Discussed options for colon cancer screen at 43-pt will think about it and let us  know next mo (cologuard vs colonoscopy)  Discussed fall prevention, supplements and exercise for bone density  PHQ 0 Labs reviewed

## 2024-04-05 NOTE — Assessment & Plan Note (Signed)
 More loss of libido than fatigue  Labs are reassuring

## 2024-04-05 NOTE — Patient Instructions (Addendum)
 Want to consider colon cancer screening at 43  Cologuard kit  Colonoscopy  Both options - let us  know what you prefer after you turn 43   If you can add exercise - I would consider some strength training with weights or bands   Testosterone  is in the normal range   Eat healthy/ take care of yourself  Try and work on healthy sleep habits   Avoid excessive alcohol

## 2024-04-05 NOTE — Assessment & Plan Note (Signed)
 bp in fair control at this time  BP Readings from Last 1 Encounters:  04/05/24 129/82   No changes needed Most recent labs reviewed  Disc lifstyle change with low sodium diet and exercise  bp is improved with addn of losartan  50 mg daily Taking amlodipine  10 mg daily  Tolerating all well  bmet today

## 2024-04-05 NOTE — Assessment & Plan Note (Signed)
 Disc goals for lipids and reasons to control them Rev last labs with pt Rev low sat fat diet in detail Simvastatin  20 mg daily  LDL is improved at 104 HDL up Triglycerides are down from last year

## 2024-04-05 NOTE — Assessment & Plan Note (Signed)
 Testosterone  in normal range Mood is overall good with occational irritability   Labs reviewed  Unsure of cause  Offered urology referral (or counseling if mood is issue)

## 2024-08-07 ENCOUNTER — Encounter: Payer: Self-pay | Admitting: Family Medicine

## 2024-08-07 DIAGNOSIS — Z1211 Encounter for screening for malignant neoplasm of colon: Secondary | ICD-10-CM

## 2024-08-10 DIAGNOSIS — Z1211 Encounter for screening for malignant neoplasm of colon: Secondary | ICD-10-CM | POA: Insufficient documentation

## 2025-03-31 ENCOUNTER — Other Ambulatory Visit

## 2025-04-07 ENCOUNTER — Encounter: Admitting: Family Medicine
